# Patient Record
Sex: Female | Born: 1955
Health system: Southern US, Community
[De-identification: ages and names within clinical notes are randomized; demographics above are authoritative.]

## PROBLEM LIST (undated history)

## (undated) DIAGNOSIS — F329 Major depressive disorder, single episode, unspecified: Secondary | ICD-10-CM

## (undated) DIAGNOSIS — D509 Iron deficiency anemia, unspecified: Secondary | ICD-10-CM

## (undated) DIAGNOSIS — E785 Hyperlipidemia, unspecified: Secondary | ICD-10-CM

## (undated) DIAGNOSIS — F32A Depression, unspecified: Secondary | ICD-10-CM

## (undated) DIAGNOSIS — D72819 Decreased white blood cell count, unspecified: Secondary | ICD-10-CM

## (undated) DIAGNOSIS — T7840XA Allergy, unspecified, initial encounter: Secondary | ICD-10-CM

## (undated) HISTORY — DX: Major depressive disorder, single episode, unspecified: F32.9

## (undated) HISTORY — DX: Iron deficiency anemia, unspecified: D50.9

## (undated) HISTORY — DX: Hyperlipidemia, unspecified: E78.5

## (undated) HISTORY — DX: Decreased white blood cell count, unspecified: D72.819

## (undated) HISTORY — PX: TONSILLECTOMY: SUR1361

## (undated) HISTORY — DX: Allergy, unspecified, initial encounter: T78.40XA

## (undated) HISTORY — DX: Depression, unspecified: F32.A

---
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
## 1984-03-15 HISTORY — PX: CERVICAL BIOPSY: SHX590

## 1992-03-15 HISTORY — PX: EXPLORATORY LAPAROTOMY: SUR591

## 1992-03-15 HISTORY — PX: VAGINAL HYSTERECTOMY: SHX2639

## 1992-12-18 ENCOUNTER — Encounter: Payer: Self-pay | Admitting: Family Medicine

## 2001-03-15 HISTORY — PX: ELBOW SURGERY: SHX618

## 2004-01-22 ENCOUNTER — Ambulatory Visit: Payer: Self-pay | Admitting: Ophthalmology

## 2004-06-02 ENCOUNTER — Ambulatory Visit: Payer: Self-pay | Admitting: Unknown Physician Specialty

## 2005-08-27 ENCOUNTER — Ambulatory Visit: Payer: Self-pay | Admitting: Unknown Physician Specialty

## 2006-03-15 HISTORY — PX: COLONOSCOPY: SHX174

## 2006-03-15 HISTORY — PX: BREAST BIOPSY: SHX20

## 2006-05-14 LAB — CONVERTED CEMR LAB: Pap Smear: NORMAL

## 2006-07-14 LAB — HM COLONOSCOPY: HM Colonoscopy: NORMAL

## 2006-07-15 ENCOUNTER — Encounter: Payer: Self-pay | Admitting: Family Medicine

## 2006-07-15 ENCOUNTER — Ambulatory Visit: Payer: Self-pay | Admitting: Gastroenterology

## 2006-10-27 ENCOUNTER — Ambulatory Visit: Payer: Self-pay | Admitting: Unknown Physician Specialty

## 2006-11-02 ENCOUNTER — Ambulatory Visit: Payer: Self-pay | Admitting: Unknown Physician Specialty

## 2007-05-18 ENCOUNTER — Ambulatory Visit: Payer: Self-pay | Admitting: Family Medicine

## 2007-05-18 DIAGNOSIS — H8110 Benign paroxysmal vertigo, unspecified ear: Secondary | ICD-10-CM | POA: Insufficient documentation

## 2007-05-18 DIAGNOSIS — D509 Iron deficiency anemia, unspecified: Secondary | ICD-10-CM | POA: Insufficient documentation

## 2007-05-18 DIAGNOSIS — R002 Palpitations: Secondary | ICD-10-CM | POA: Insufficient documentation

## 2007-05-18 DIAGNOSIS — B009 Herpesviral infection, unspecified: Secondary | ICD-10-CM | POA: Insufficient documentation

## 2007-05-18 DIAGNOSIS — J309 Allergic rhinitis, unspecified: Secondary | ICD-10-CM | POA: Insufficient documentation

## 2007-05-18 DIAGNOSIS — E785 Hyperlipidemia, unspecified: Secondary | ICD-10-CM | POA: Insufficient documentation

## 2007-05-18 DIAGNOSIS — F3289 Other specified depressive episodes: Secondary | ICD-10-CM | POA: Insufficient documentation

## 2007-05-18 DIAGNOSIS — F329 Major depressive disorder, single episode, unspecified: Secondary | ICD-10-CM | POA: Insufficient documentation

## 2007-05-22 ENCOUNTER — Ambulatory Visit: Payer: Self-pay | Admitting: Family Medicine

## 2007-05-26 LAB — CONVERTED CEMR LAB
ALT: 18 units/L (ref 0–35)
AST: 24 units/L (ref 0–37)
Albumin: 4 g/dL (ref 3.5–5.2)
Alkaline Phosphatase: 23 units/L — ABNORMAL LOW (ref 39–117)
BUN: 13 mg/dL (ref 6–23)
Bilirubin, Direct: 0.1 mg/dL (ref 0.0–0.3)
CO2: 31 meq/L (ref 19–32)
Calcium: 9.4 mg/dL (ref 8.4–10.5)
Chloride: 108 meq/L (ref 96–112)
Cholesterol: 237 mg/dL (ref 0–200)
Creatinine, Ser: 0.9 mg/dL (ref 0.4–1.2)
Direct LDL: 124.4 mg/dL
GFR calc Af Amer: 85 mL/min
GFR calc non Af Amer: 70 mL/min
Glucose, Bld: 95 mg/dL (ref 70–99)
HDL: 95.2 mg/dL (ref >39.0)
Potassium: 4.4 meq/L (ref 3.5–5.1)
Sodium: 143 meq/L (ref 135–145)
Total Bilirubin: 0.9 mg/dL (ref 0.3–1.2)
Total CHOL/HDL Ratio: 2.5
Total Protein: 6.5 g/dL (ref 6.0–8.3)
Triglycerides: 69 mg/dL (ref 0–149)
VLDL: 14 mg/dL (ref 0–40)

## 2007-06-08 ENCOUNTER — Ambulatory Visit: Payer: Self-pay | Admitting: Family Medicine

## 2007-09-25 ENCOUNTER — Telehealth: Payer: Self-pay | Admitting: Family Medicine

## 2007-12-25 ENCOUNTER — Telehealth: Payer: Self-pay | Admitting: Family Medicine

## 2008-01-25 ENCOUNTER — Telehealth: Payer: Self-pay | Admitting: Family Medicine

## 2008-03-21 ENCOUNTER — Encounter: Payer: Self-pay | Admitting: Family Medicine

## 2008-03-21 ENCOUNTER — Ambulatory Visit: Payer: Self-pay | Admitting: Family Medicine

## 2008-03-25 ENCOUNTER — Encounter (INDEPENDENT_AMBULATORY_CARE_PROVIDER_SITE_OTHER): Payer: Self-pay | Admitting: *Deleted

## 2008-03-28 ENCOUNTER — Telehealth: Payer: Self-pay | Admitting: Family Medicine

## 2008-05-23 ENCOUNTER — Encounter (INDEPENDENT_AMBULATORY_CARE_PROVIDER_SITE_OTHER): Payer: Self-pay | Admitting: *Deleted

## 2008-05-30 ENCOUNTER — Ambulatory Visit: Payer: Self-pay | Admitting: Family Medicine

## 2008-05-30 LAB — CONVERTED CEMR LAB
Bilirubin Urine: NEGATIVE
Blood in Urine, dipstick: NEGATIVE
Glucose, Urine, Semiquant: NEGATIVE
Ketones, urine, test strip: NEGATIVE
Nitrite: NEGATIVE
Specific Gravity, Urine: 1.01
Urobilinogen, UA: 0.2
WBC Urine, dipstick: NEGATIVE
pH: 6.5

## 2008-05-31 ENCOUNTER — Ambulatory Visit: Payer: Self-pay | Admitting: Family Medicine

## 2008-06-02 LAB — CONVERTED CEMR LAB
ALT: 17 units/L (ref 0–35)
AST: 18 units/L (ref 0–37)
Albumin: 4 g/dL (ref 3.5–5.2)
Alkaline Phosphatase: 25 units/L — ABNORMAL LOW (ref 39–117)
BUN: 14 mg/dL (ref 6–23)
Basophils Absolute: 0 10*3/uL (ref 0.0–0.1)
Basophils Relative: 0.5 % (ref 0.0–3.0)
Bilirubin, Direct: 0 mg/dL (ref 0.0–0.3)
CO2: 30 meq/L (ref 19–32)
Calcium: 9.1 mg/dL (ref 8.4–10.5)
Chloride: 110 meq/L (ref 96–112)
Cholesterol: 259 mg/dL — ABNORMAL HIGH (ref 0–200)
Creatinine, Ser: 0.9 mg/dL (ref 0.4–1.2)
Direct LDL: 163.8 mg/dL
Eosinophils Absolute: 0.2 10*3/uL (ref 0.0–0.7)
Eosinophils Relative: 5.3 % — ABNORMAL HIGH (ref 0.0–5.0)
Glucose, Bld: 78 mg/dL (ref 70–99)
HCT: 35.2 % — ABNORMAL LOW (ref 36.0–46.0)
HDL: 73.2 mg/dL (ref >39.00)
Hemoglobin: 12.2 g/dL (ref 12.0–15.0)
Lymphocytes Relative: 22.6 % (ref 12.0–46.0)
Lymphs Abs: 0.9 10*3/uL (ref 0.7–4.0)
MCHC: 34.6 g/dL (ref 30.0–36.0)
MCV: 90.4 fL (ref 78.0–100.0)
Monocytes Absolute: 0.3 10*3/uL (ref 0.1–1.0)
Monocytes Relative: 7 % (ref 3.0–12.0)
Neutro Abs: 2.6 10*3/uL (ref 1.4–7.7)
Neutrophils Relative %: 64.6 % (ref 43.0–77.0)
Platelets: 172 10*3/uL (ref 150.0–400.0)
Potassium: 4.3 meq/L (ref 3.5–5.1)
RBC: 3.89 M/uL (ref 3.87–5.11)
RDW: 12 % (ref 11.5–14.6)
Sed Rate: 9 mm/hr (ref 0–22)
Sodium: 144 meq/L (ref 135–145)
TSH: 2.45 microintl units/mL (ref 0.35–5.50)
Total Bilirubin: 0.7 mg/dL (ref 0.3–1.2)
Total CHOL/HDL Ratio: 4
Total Protein: 6.3 g/dL (ref 6.0–8.3)
Triglycerides: 87 mg/dL (ref 0.0–149.0)
VLDL: 17.4 mg/dL (ref 0.0–40.0)
WBC: 4 10*3/uL — ABNORMAL LOW (ref 4.5–10.5)

## 2008-06-04 ENCOUNTER — Encounter (INDEPENDENT_AMBULATORY_CARE_PROVIDER_SITE_OTHER): Payer: Self-pay | Admitting: *Deleted

## 2008-06-26 ENCOUNTER — Ambulatory Visit: Payer: Self-pay | Admitting: Family Medicine

## 2008-06-26 DIAGNOSIS — Z780 Asymptomatic menopausal state: Secondary | ICD-10-CM | POA: Insufficient documentation

## 2008-09-30 ENCOUNTER — Ambulatory Visit: Payer: Self-pay | Admitting: Family Medicine

## 2008-09-30 LAB — CONVERTED CEMR LAB
ALT: 25 units/L (ref 0–35)
AST: 37 units/L (ref 0–37)
Cholesterol: 192 mg/dL (ref 0–200)
HDL: 84.6 mg/dL (ref >39.00)
LDL Cholesterol: 91 mg/dL (ref 0–99)
Total CHOL/HDL Ratio: 2
Triglycerides: 80 mg/dL (ref 0.0–149.0)
VLDL: 16 mg/dL (ref 0.0–40.0)

## 2009-01-16 ENCOUNTER — Ambulatory Visit: Payer: Self-pay | Admitting: Family Medicine

## 2009-01-16 DIAGNOSIS — M279 Disease of jaws, unspecified: Secondary | ICD-10-CM | POA: Insufficient documentation

## 2009-01-17 LAB — CONVERTED CEMR LAB: Sed Rate: 10 mm/hr (ref 0–22)

## 2009-02-04 ENCOUNTER — Telehealth: Payer: Self-pay | Admitting: Family Medicine

## 2009-04-02 ENCOUNTER — Encounter (INDEPENDENT_AMBULATORY_CARE_PROVIDER_SITE_OTHER): Payer: Self-pay | Admitting: *Deleted

## 2009-04-04 ENCOUNTER — Telehealth: Payer: Self-pay | Admitting: Family Medicine

## 2009-04-18 ENCOUNTER — Ambulatory Visit: Payer: Self-pay | Admitting: Family Medicine

## 2009-04-18 LAB — CONVERTED CEMR LAB
ALT: 21 units/L (ref 0–35)
AST: 25 units/L (ref 0–37)
Cholesterol: 178 mg/dL (ref 0–200)
HDL: 87.8 mg/dL (ref >39.00)
LDL Cholesterol: 77 mg/dL (ref 0–99)
Total CHOL/HDL Ratio: 2
Triglycerides: 67 mg/dL (ref 0.0–149.0)
VLDL: 13.4 mg/dL (ref 0.0–40.0)

## 2009-05-05 ENCOUNTER — Telehealth: Payer: Self-pay | Admitting: Family Medicine

## 2009-06-04 ENCOUNTER — Telehealth: Payer: Self-pay | Admitting: Family Medicine

## 2009-06-10 ENCOUNTER — Telehealth (INDEPENDENT_AMBULATORY_CARE_PROVIDER_SITE_OTHER): Payer: Self-pay | Admitting: *Deleted

## 2009-07-14 ENCOUNTER — Telehealth: Payer: Self-pay | Admitting: Family Medicine

## 2009-07-16 ENCOUNTER — Telehealth: Payer: Self-pay | Admitting: Family Medicine

## 2009-08-22 ENCOUNTER — Ambulatory Visit: Payer: Self-pay | Admitting: Family Medicine

## 2009-08-22 ENCOUNTER — Encounter: Payer: Self-pay | Admitting: Family Medicine

## 2009-08-22 LAB — HM MAMMOGRAPHY: HM Mammogram: NORMAL

## 2009-08-25 ENCOUNTER — Encounter (INDEPENDENT_AMBULATORY_CARE_PROVIDER_SITE_OTHER): Payer: Self-pay | Admitting: *Deleted

## 2009-09-02 ENCOUNTER — Ambulatory Visit: Payer: Self-pay | Admitting: Family Medicine

## 2009-09-02 ENCOUNTER — Telehealth (INDEPENDENT_AMBULATORY_CARE_PROVIDER_SITE_OTHER): Payer: Self-pay | Admitting: *Deleted

## 2009-09-02 DIAGNOSIS — R5383 Other fatigue: Secondary | ICD-10-CM

## 2009-09-02 DIAGNOSIS — R5381 Other malaise: Secondary | ICD-10-CM | POA: Insufficient documentation

## 2009-09-07 LAB — CONVERTED CEMR LAB
ALT: 27 units/L (ref 0–35)
AST: 29 units/L (ref 0–37)
Albumin: 4.4 g/dL (ref 3.5–5.2)
Alkaline Phosphatase: 34 units/L — ABNORMAL LOW (ref 39–117)
BUN: 18 mg/dL (ref 6–23)
Basophils Absolute: 0 10*3/uL (ref 0.0–0.1)
Basophils Relative: 0.5 % (ref 0.0–3.0)
Bilirubin, Direct: 0.1 mg/dL (ref 0.0–0.3)
CO2: 29 meq/L (ref 19–32)
Calcium: 9.4 mg/dL (ref 8.4–10.5)
Chloride: 107 meq/L (ref 96–112)
Cholesterol: 185 mg/dL (ref 0–200)
Creatinine, Ser: 0.9 mg/dL (ref 0.4–1.2)
Eosinophils Absolute: 0.1 10*3/uL (ref 0.0–0.7)
Eosinophils Relative: 3 % (ref 0.0–5.0)
GFR calc non Af Amer: 71.14 mL/min (ref >60)
Glucose, Bld: 84 mg/dL (ref 70–99)
HCT: 34.6 % — ABNORMAL LOW (ref 36.0–46.0)
HDL: 90.5 mg/dL (ref >39.00)
Hemoglobin: 12 g/dL (ref 12.0–15.0)
LDL Cholesterol: 79 mg/dL (ref 0–99)
Lymphocytes Relative: 24.1 % (ref 12.0–46.0)
Lymphs Abs: 1 10*3/uL (ref 0.7–4.0)
MCHC: 34.7 g/dL (ref 30.0–36.0)
MCV: 92.2 fL (ref 78.0–100.0)
Monocytes Absolute: 0.3 10*3/uL (ref 0.1–1.0)
Monocytes Relative: 6.8 % (ref 3.0–12.0)
Neutro Abs: 2.6 10*3/uL (ref 1.4–7.7)
Neutrophils Relative %: 65.6 % (ref 43.0–77.0)
Platelets: 195 10*3/uL (ref 150.0–400.0)
Potassium: 4.7 meq/L (ref 3.5–5.1)
RBC: 3.75 M/uL — ABNORMAL LOW (ref 3.87–5.11)
RDW: 13.5 % (ref 11.5–14.6)
Sodium: 141 meq/L (ref 135–145)
TSH: 2.68 microintl units/mL (ref 0.35–5.50)
Total Bilirubin: 0.7 mg/dL (ref 0.3–1.2)
Total CHOL/HDL Ratio: 2
Total Protein: 6.9 g/dL (ref 6.0–8.3)
Triglycerides: 77 mg/dL (ref 0.0–149.0)
VLDL: 15.4 mg/dL (ref 0.0–40.0)
Vit D, 25-Hydroxy: 52 ng/mL (ref 30–89)
WBC: 4 10*3/uL — ABNORMAL LOW (ref 4.5–10.5)

## 2009-09-09 ENCOUNTER — Ambulatory Visit: Payer: Self-pay | Admitting: Family Medicine

## 2009-09-09 LAB — HM PAP SMEAR

## 2009-09-09 LAB — CONVERTED CEMR LAB

## 2009-11-03 ENCOUNTER — Telehealth: Payer: Self-pay | Admitting: Family Medicine

## 2009-12-10 ENCOUNTER — Telehealth: Payer: Self-pay | Admitting: Family Medicine

## 2010-01-06 ENCOUNTER — Telehealth: Payer: Self-pay | Admitting: Family Medicine

## 2010-02-02 ENCOUNTER — Telehealth: Payer: Self-pay | Admitting: Family Medicine

## 2010-02-27 ENCOUNTER — Telehealth: Payer: Self-pay | Admitting: Family Medicine

## 2010-03-17 ENCOUNTER — Telehealth: Payer: Self-pay | Admitting: Family Medicine

## 2010-04-09 ENCOUNTER — Telehealth: Payer: Self-pay | Admitting: Family Medicine

## 2010-04-14 NOTE — Letter (Signed)
Summary: Dr.DeFrancesco Records  Dr.DeFrancesco Records   Imported By: Beau Fanny 10/05/2007 13:27:34  _____________________________________________________________________  External Attachment:    Type:   Image     Comment:   External Document

## 2010-04-14 NOTE — Assessment & Plan Note (Signed)
Summary: CPX   Vital Signs:  Patient Profile:   55 Years Old Female Height:     63 inches Weight:      130.13 pounds Temp:     98.1 degrees F oral Pulse rate:   80 / minute Pulse rhythm:   regular BP sitting:   102 / 54  (left arm) Cuff size:   regular  Vitals Entered By: Delilah Shan (June 08, 2007 11:23 AM)                 Chief Complaint:  CPX.  History of Present Illness: no breast lesions, no nipple discharge no vaginal discharge.     Current Allergies (reviewed today): No known allergies   Past Medical History:    Reviewed history from 05/18/2007 and no changes required:       Anemia-iron deficiency       Depression       Allergic rhinitis       Hyperlipidemia  Past Surgical History:    1994 hysterectomy, vaginal, no cervix, still has ovaries    24 hour hear tmonitor neg    1973 pilonidal cyst removal    1990,1993 C section    1986 cone biopsy of cervix ;     1994 exp lap for mennorhagia, no endometriosis    2003 elbow surgery for chronic tendonitis    Tonsillectomy    2008 breast needle biopsy at [facility]: fibrocystic disease     Review of Systems  ENT      allergies improved with allergra  CV      Denies chest pain or discomfort.  Resp      Denies cough and shortness of breath.  GI      Denies abdominal pain, bloody stools, and change in bowel habits.  GU      Denies dysuria.  MS      Denies joint pain.  Derm      no concerning skin lesions  Psych      Denies depression, easily tearful, and panic attacks.      well controlled mood off medication   Physical Exam  General:     Well-developed,well-nourished,in no acute distress; alert,appropriate and cooperative throughout examination Head:     Normocephalic and atraumatic without obvious abnormalities. No apparent alopecia or balding. Neck:     no carotid bruit or thyromegaly  Breasts:     No mass, nodules, thickening, tenderness, bulging, retraction, inflamation, nipple  discharge or skin changes noted.  Scar at left breast 1 ocklock from prevvious bx. Lungs:     Normal respiratory effort, chest expands symmetrically. Lungs are clear to auscultation, no crackles or wheezes. Heart:     Normal rate and regular rhythm. S1 and S2 normal without gallop, murmur, click, rub or other extra sounds. Abdomen:     Bowel sounds positive,abdomen soft and non-tender without masses, organomegaly or hernias noted. Genitalia:     Pelvic Exam:        External: normal female genitalia without lesions or masses        Vagina: normal without lesions or masses Cervix: not present        Adnexa: normal bimanual exam without masses or fullness        Uterus: not present        Pap smear: not performed Pulses:     R and L posterior tibial pulses are full and equal bilaterally     Impression & Recommendations:  Problem # 1:  Preventive Health Care (ICD-V70.0) Updated vaccines. Cholesterol and DM up to date.  No pap done because no cervix.   Encouraged continued  exercise and healthy eating habits.   Problem # 2:  ROUTINE GYNECOLOGICAL EXAMINATION (ICD-V72.31) Nml bimanual exam.  No cervix present.   Complete Medication List: 1)  Estradiol 0.1 Mg/24hr Ptwk (Estradiol) .... Take 1 tablet by mouth once a day 2)  Cal/mag Citrate 250-125 Mg Tabs (Calcium-magnesium) .... Take 1 tablet by mouth two times a day 3)  Gnp Flax Seed Oil 1000 Mg Caps (Flaxseed (linseed)) .... Take 1 capsule by mouth once a day 4)  Milk Thistle 140 Mg Caps (Milk thistle) .... Take 1 capsule by mouth once a day 5)  Cvs Vitamin B-6 100 Mg Tabs (Pyridoxine hcl) .... Take 1 tablet by mouth once a day 6)  Vitamin C 500 Mg Tabs (Ascorbic acid) .... Take 1 tablet by mouth once a day 7)  Vitamin D 400 Unit Caps (Cholecalciferol) .... Take 1 capsule by mouth once a day 8)  Resvertal  .... Once daily 9)  Valtrex 1 Gm Tabs (Valacyclovir hcl) .... As needed herpes out break 10)  Fexofenadine Hcl 180 Mg Tabs  (Fexofenadine hcl) .... Take 1 tablet by mouth once a day 11)  Nasonex 50 Mcg/act Susp (Mometasone furoate) .... 2 sprays per nostril daily     ] Current Allergies (reviewed today): No known allergies  Current Medications (including changes made in today's visit):  ESTRADIOL 0.1 MG/24HR  PTWK (ESTRADIOL) Take 1 tablet by mouth once a day CAL/MAG CITRATE 250-125 MG  TABS (CALCIUM-MAGNESIUM) Take 1 tablet by mouth two times a day GNP FLAX SEED OIL 1000 MG  CAPS (FLAXSEED (LINSEED)) Take 1 capsule by mouth once a day MILK THISTLE 140 MG  CAPS (MILK THISTLE) Take 1 capsule by mouth once a day CVS VITAMIN B-6 100 MG  TABS (PYRIDOXINE HCL) Take 1 tablet by mouth once a day VITAMIN C 500 MG  TABS (ASCORBIC ACID) Take 1 tablet by mouth once a day VITAMIN D 400 UNIT  CAPS (CHOLECALCIFEROL) Take 1 capsule by mouth once a day * RESVERTAL once daily VALTREX 1 GM  TABS (VALACYCLOVIR HCL) as needed herpes out break FEXOFENADINE HCL 180 MG  TABS (FEXOFENADINE HCL) Take 1 tablet by mouth once a day NASONEX 50 MCG/ACT  SUSP (MOMETASONE FUROATE) 2 sprays per nostril daily  Flex Sig Next Due:  Not Indicated Hemoccult Next Due:  Not Indicated   Appended Document: CPX   Tetanus/Td Vaccine    Vaccine Type: Tdap    Site: left deltoid    Mfr: Sanofi Pasteur    Dose: 0.5 ml    Route: IM    Given by: Delilah Shan    Exp. Date: 04/30/2009    Lot #: [id number]    VIS given: 01/31/07 version given June 08, 2007.

## 2010-04-14 NOTE — Progress Notes (Signed)
----   Converted from flag ---- ---- 09/01/2009 9:56 AM, Hannah Beat MD wrote: Prephysical Labs, several days before, fasting BMP, HFP, FLP, CBC with diff, TSH, Vit D: 272.4, v77.1, ,780.79  ---- 09/01/2009 7:56 AM, Liane Comber CMA (AAMA) wrote: Ermalene Searing pt is scheduled for cpx labs tomorrow, what labs to draw and dx codes? Thanks Tasha ------------------------------

## 2010-04-14 NOTE — Progress Notes (Signed)
Summary: Mammogram scheduled...  Phone Note Call from Patient   Caller: Patient Summary of Call: please put order in for a MMG at [facility] . Call pt back at [phone]. Initial call taken by: Carlton Adam,  June 10, 2009 12:39 PM  Follow-up for Phone Call        Mammogram scheduled w/ [facility].Daine Gip  June 18, 2009 4:37 PM Follow-up by: Daine Gip,  June 18, 2009 4:37 PM

## 2010-04-14 NOTE — Letter (Signed)
Summary: Results Follow-up Letter  [place] at [facility]  [address] [place], [place] [address]   Phone: [phone]  Fax: [phone]    06/04/2008       Natasha Dixon [address] [place], [place]  [address]  Dear [name],   The following are the results of your recent test(s):  Test     Result     Pap Smear    Normal_______  Not Normal_____       Comments: _________________________________________________________ Cholesterol LDL(Bad cholesterol):          Your goal is less than:         HDL (Good cholesterol):        Your goal is more than: _________________________________________________________ Other Tests:   _________________________________________________________  Please call for an appointment Or ___Please see attached lab reesults.______________________________________________________ _________________________________________________________ _________________________________________________________  Sincerely,  Ardy[place] Man [place] at [facility]

## 2010-04-14 NOTE — Progress Notes (Signed)
Summary: Rx Tramadol  Phone Note Refill Request Call back at [phone] [facility] on February 02, 2010 12:43 PM  Refills Requested: Medication #1:  TRAMADOL HCL 50 MG TABS 1-2 tab by mouth at bedtime as needed pain   Last Refilled: 01/06/2010  Method Requested: Electronic Initial call taken by: Sydell Axon LPN,  February 02, 2010 12:43 PM    Prescriptions: TRAMADOL HCL 50 MG TABS (TRAMADOL HCL) 1-2 tab by mouth at bedtime as needed pain  #30 Tablet x 0   Entered and Authorized by:   Ruthe Mannan MD   Signed by:   Ruthe Mannan MD on 02/02/2010   Method used:   [facility]        [id number]       [address]       [place], [place]  [address]       Ph: [phone]       Fax: [phone]   RxID:   [id number]

## 2010-04-14 NOTE — Assessment & Plan Note (Signed)
Summary: ?RASH/CLE   Vital Signs:  Patient profile:   55 year old female Height:      63 inches Weight:      131.50 pounds BMI:     23.38 Temp:     98.2 degrees F oral Pulse rate:   72 / minute Pulse rhythm:   regular BP sitting:   102 / 66  (left arm) Cuff size:   regular  Vitals Entered By: Delilah Shan (May 30, 2008 8:55 AM) CC: Rash   History of Present Illness: In 01/2008 broke out in rash on arms B, very puritic, red bumpy, felt burning at night. Saw dermatologist..dx with contact dermatitis, unknown trigger...gave steroid creams. Spread to thighs. Also treated for scabies. Derm took biopsy...showed nothing. Given oral course of prednison...Marland Kitchenheped some Then gradually began improving by 03/08/2008  Since then she continued to have some skin itchyness. Until 3 weeks ago had similar breakout on arms.  Has also noted cicrles under eyes, eyes occ red. Does have some postnasal drip.Marland Kitchen minimal improvement wit fexofenadine last year Occ urine cloudy, strange odor.  Fatigue for a longtime. Stress in life. Some joint pain in Ams, shoulders, hands, wrists. No stool changes. No fever, no N/V.  On estradiol but still has a lot of hot flashes. Cold during the day.     Allergies: No Known Drug Allergies  Physical Exam  General:  Well-developed,well-nourished,in no acute distress; alert,appropriate and cooperative throughout examination Eyes:  circles under eyes, slight conjunctival erythmea Ears:  External ear exam shows no significant lesions or deformities.  Otoscopic examination reveals clear canals, tympanic membranes are intact bilaterally without bulging, retraction, inflammation or discharge. Hearing is grossly normal bilaterally. Nose:  External nasal examination shows no deformity or inflammation. Nasal mucosa are pink and moist without lesions or exudates. Mouth:  Oral mucosa and oropharynx without lesions or exudates.  Teeth in good repair. Neck:  no carotid  bruit or thyromegaly no cervical or supraclavicular lymphadenopathy  Lungs:  Normal respiratory effort, chest expands symmetrically. Lungs are clear to auscultation, no crackles or wheezes. Heart:  Normal rate and regular rhythm. S1 and S2 normal without gallop, murmur, click, rub or other extra sounds. Abdomen:  Bowel sounds positive,abdomen soft and non-tender without masses, organomegaly or hernias noted. Skin:  erythemaotis papules on arms and low back, on back slight diffuse erythema and warmth Psych:  Cognition and judgment appear intact. Alert and cooperative with normal attention span and concentration. No apparent delusions, illusions, hallucinations   Impression & Recommendations:  Problem # 1:  SKIN RASH (ICD-782.1) likely allergic dermatitis. Will eval for other etiologuses given additional symptoms of fatigue etc. Likely needs antihistamine and allergy referral for skin testing. No clear med trigger Her updated medication list for this problem includes:    Clotrimazole-betamethasone 1-0.05 % Crea (Clotrimazole-betamethasone) .Marland Kitchen... Aaa as directed  Problem # 2:  OTHER ABNORMALITY OF URINATION (UEA-540.98)  Orders: UA Dipstick w/o Micro (manual) (11914)  Complete Medication List: 1)  Cal/mag Citrate 250-125 Mg Tabs (Calcium-magnesium) .... Take 1 tablet by mouth two times a day 2)  Gnp Flax Seed Oil 1000 Mg Caps (Flaxseed (linseed)) .... Take 1 capsule by mouth once a day 3)  Milk Thistle 140 Mg Caps (Milk thistle) .... Take 1 capsule by mouth once a day 4)  Cvs Vitamin B-6 100 Mg Tabs (Pyridoxine hcl) .... Take 1 tablet by mouth once a day 5)  Vitamin C 500 Mg Tabs (Ascorbic acid) .... Take 1 tablet by mouth once a  day 6)  Vitamin D 400 Unit Caps (Cholecalciferol) .... Take 1 capsule by mouth once a day 7)  Valtrex 1 Gm Tabs (Valacyclovir hcl) .Marland Kitchen.. 1 tab po twice daily x 1 day as needed herpes out break 8)  Nasonex 50 Mcg/act Susp (Mometasone furoate) .... 2 sprays per nostril  daily as needed 9)  Clotrimazole-betamethasone 1-0.05 % Crea (Clotrimazole-betamethasone) .... Aaa as directed 10)  Estradiol 1 Mg Tabs (Estradiol) .... Take 1 tablet by mouth once a day  Patient Instructions: 1)  Fasting lasb Dx 782.1 sed rate, cbc, TSH, Dx 272.0 CMET, lipids 2)  The medication list was reviewed and reconciled.  All changed / newly prescribed medications were explained.  A complete medication list was provided to the patient / caregiver.      Current Allergies (reviewed today): No known allergies  Laboratory Results   Urine Tests   Date/Time Reported: May 30, 2008 9:45 AM   Routine Urinalysis   Color: yellow Appearance: Clear Glucose: negative   (Normal Range: Negative) Bilirubin: negative   (Normal Range: Negative) Ketone: negative   (Normal Range: Negative) Spec. Gravity: 1.010   (Normal Range: 1.003-1.035) Blood: negative   (Normal Range: Negative) pH: 6.5   (Normal Range: 5.0-8.0) Protein: trace   (Normal Range: Negative) Urobilinogen: 0.2   (Normal Range: 0-1) Nitrite: negative   (Normal Range: Negative) Leukocyte Esterace: negative   (Normal Range: Negative)

## 2010-04-14 NOTE — Progress Notes (Signed)
Summary: wants labs prior to physical  Phone Note Call from Patient Call back at Home Phone [phone]   Caller: Patient Call For: Kerby Nora MD Summary of Call: Pt has cpx scheduled for 4/14 and would like labs ordered prior.  Wants cholesterol and liver and kidney functions checked. Initial call taken by: Lowella Petties,  March 28, 2008 4:49 PM  Follow-up for Phone Call        Dx 272.4 CMET, lipids fasting Follow-up by: Kerby Nora MD,  March 29, 2008 3:06 PM  Additional Follow-up for Phone Call Additional follow up Details #1::        Husband advised.  Lab appointment scheduled:  06/21/2008 at 8:25 a.m.  (fasting) Additional Follow-up by: Delilah Shan,  March 29, 2008 3:27 PM

## 2010-04-14 NOTE — Letter (Signed)
Summary: [place] No Show Letter  [place] at [facility]  [address] [place], [place] [address]   Phone: [phone]  Fax: [phone]    04/02/2009 MRN: [id number]  [facility] [address] [place], [place]  [address]   Dear Ms. Cherne,   Our records indicate that you missed your scheduled appointment with __lab___________________ on _1.19.11___________.  Please contact this office to reschedule your appointment as soon as possible.  It is important that you keep your scheduled appointments with your physician, so we can provide you the best care possible.  Please be advised that there may be a charge for "no show" appointments.    Sincerely,   [place] at [facility]

## 2010-04-14 NOTE — Progress Notes (Signed)
Summary: tramadol  Phone Note Refill Request Message from:  [employer] on May 05, 2009 8:45 AM  Refills Requested: Medication #1:  TRAMADOL HCL 50 MG TABS 1-2 tab by mouth at bedtime as needed pain.   [facility]ed[facility]   Method Requested: Electronic Initial call taken by: Benny Lennert CMA Duncan Dull),  May 05, 2009 8:45 AM  Follow-up for Phone Call        Rx called to pharmacy Follow-up by: Benny Lennert CMA Duncan Dull),  May 05, 2009 11:29 AM    Prescriptions: TRAMADOL HCL 50 MG TABS (TRAMADOL HCL) 1-2 tab by mouth at bedtime as needed pain  #30 Tablet x 0   Entered and Authorized by:   Kerby Nora MD   Signed by:   Kerby Nora MD on 05/05/2009   Method used:   [facility] ...       [facility]  [address] [id number]       [address]       [place], [place]  [address]       Ph: [phone]       Fax: [phone]   RxID:   [id number]

## 2010-04-14 NOTE — Assessment & Plan Note (Signed)
Summary: NEW PT TO EST/CLE   Vital Signs:  Patient Profile:   55 Years Old Female Height:     63 inches Weight:      131 pounds Temp:     97.7 degrees F oral Pulse rate:   60 / minute Pulse rhythm:   regular BP sitting:   92 / 56  (left arm) Cuff size:   regular  Vitals Entered By: Delilah Shan (May 18, 2007 2:06 PM)                 Chief Complaint:  New Patient to Establish.  History of Present Illness: Allergies and BPPV: has constant PND, congestion Benadryl, zyrtec with no improvement, and felt groggy Does Eply manuever's intermittantly  High cholesterol: last checked 3/08 was elevated, had been on pravochol but took herself off to try dietary changes and exercise quit smoking  ? hx/o depression: had been on wellbutrin till she took herself off in 9/08 She feels she has been doing well off of it.  Still has hot flashes at night despite estradiol 1 mg daily     Current Allergies (reviewed today): No known allergies   Past Medical History:    Anemia-iron deficiency    Depression    Allergic rhinitis    Hyperlipidemia  Past Surgical History:    1994 hysterectomy, vaginal, ?cervix, still has ovaries    24 hour hear tmonitor neg    1973 pilonidal cyst removal    1990,1993 C section    1986 cone biopsy of cervix ;     1994 exp lap for mennorhagia, no endometriosis    2003 elbow surgery for chronic tendonitis    Tonsillectomy    2008 breast needle biopsy at [facility]: fibrocystic disease   Family History:    mother: high chol    father: arthritis, CAD, Mi at age 4    Family History of CAD Female 1st degree relative <65    sister: HTN, high chol, hypothyroid    aunt: hypothyroid    no cancer    PGF: carotid artery blockage  Social History:    Occupation: housewife    Married    2 children: healthy    Former Smoker 20 pack year history, Quit 9/08    Alcohol use-yes, wine, cocktails on weekends    Drug use-yes, remote marijuana    Regular  exercise-yes, 3 x a week    Diet: increasing fish, lean meats, low carb   Risk Factors:  Tobacco use:  quit Drug use:  yes Alcohol use:  yes Exercise:  yes  Mammogram History:     Date of Last Mammogram:  10/14/2006    Results:  Abnormal Left   Colonoscopy History:     Date of Last Colonoscopy:  07/14/2006    Results:  Normal   PAP Smear History:     Date of Last PAP Smear:  05/14/2006    Results:  Normal    Review of Systems       couple times a week feels like heart skips beat, not progressive, kasts sec   Physical Exam  General:     Well-developed,well-nourished,in no acute distress; alert,appropriate and cooperative throughout examination Head:     Normocephalic and atraumatic without obvious abnormalities. No apparent alopecia or balding. Ears:     External ear exam shows no significant lesions or deformities.  Otoscopic examination reveals clear canals, tympanic membranes are intact bilaterally without bulging, retraction, inflammation or discharge. Hearing is grossly  normal bilaterally. Nose:     nasal dischargemucosal pallor.   Mouth:     PND Lungs:     Normal respiratory effort, chest expands symmetrically. Lungs are clear to auscultation, no crackles or wheezes. Heart:     Normal rate and regular rhythm. S1 and S2 normal without gallop, murmur, click, rub or other extra sounds. Abdomen:     Bowel sounds positive,abdomen soft and non-tender without masses, organomegaly or hernias noted. Pulses:     R and L posterior tibial pulses are full and equal bilaterally  Extremities:     no edema    Impression & Recommendations:  Problem # 1:  ALLERGIC RHINITIS (ICD-477.9) Allergen avoidance. treat with fexofenadine and nasonex. Follow up if not improving Her updated medication list for this problem includes:    Fexofenadine Hcl 180 Mg Tabs (Fexofenadine hcl) .Marland Kitchen... Take 1 tablet by mouth once a day    Nasonex 50 Mcg/act Susp (Mometasone furoate) .Marland Kitchen... 2  sprays per nostril daily   Problem # 2:  DEPRESSION (ICD-311) well controlled off medication  Problem # 3:  PALPITATIONS, RECURRENT (ICD-785.1) If continues, consider 28 day holter with referral to cardiology.  Problem # 4:  HYPERLIPIDEMIA (ICD-272.4) Due for check off of medication.  Problem # 5:  Preventive Health Care (ICD-V70.0) Assessment: Comment Only ? last tetanus. Due for ? pap (? cervix), bimanual and breast exam.   Complete Medication List: 1)  Estradiol 0.1 Mg/24hr Ptwk (Estradiol) .... Take 1 tablet by mouth once a day 2)  Cal/mag Citrate 250-125 Mg Tabs (Calcium-magnesium) .... Take 1 tablet by mouth two times a day 3)  Gnp Flax Seed Oil 1000 Mg Caps (Flaxseed (linseed)) .... Take 1 capsule by mouth once a day 4)  Milk Thistle 140 Mg Caps (Milk thistle) .... Take 1 capsule by mouth once a day 5)  [facility]vs Vitamin B-6 100 Mg Tabs (Pyridoxine hcl) .... Take 1 tablet by mouth once a day 6)  Vitamin C 500 Mg Tabs (Ascorbic acid) .... Take 1 tablet by mouth once a day 7)  Vitamin D 400 Unit Caps (Cholecalciferol) .... Take 1 capsule by mouth once a day 8)  Resvertal  .... Once daily 9)  Valtrex 1 Gm Tabs (Valacyclovir hcl) .... As needed herpes out break 10)  Fexofenadine Hcl 180 Mg Tabs (Fexofenadine hcl) .... Take 1 tablet by mouth once a day 11)  Nasonex 50 Mcg/act Susp (Mometasone furoate) .... 2 sprays per nostril daily   Patient Instructions: 1)  Fasting lipids, CMET Dx 272.0 2)  Schedule CPE. 3)  Trial of nasonex and fexofenadine. Call if no improvement after 2 weeks.    Prescriptions: FEXOFENADINE HCL 180 MG  TABS (FEXOFENADINE HCL) Take 1 tablet by mouth once a day  #30 x 5   Entered and Authorized by:   Kerby Nora MD   Signed by:   Kerby Nora MD on 05/18/2007   Method used:   Print then Give to Patient   RxID:   [id number]  ] Current Allergies (reviewed today): No known allergies  Current Medications (including changes made in today's visit):    ESTRADIOL 0.1 MG/24HR  PTWK (ESTRADIOL) Take 1 tablet by mouth once a day CAL/MAG CITRATE 250-125 MG  TABS (CALCIUM-MAGNESIUM) Take 1 tablet by mouth two times a day GNP FLAX SEED OIL 1000 MG  CAPS (FLAXSEED (LINSEED)) Take 1 capsule by mouth once a day MILK THISTLE 140 MG  CAPS (MILK THISTLE) Take 1 capsule by mouth once a day  [facility]S VITAMIN B-6 100 MG  TABS (PYRIDOXINE HCL) Take 1 tablet by mouth once a day VITAMIN C 500 MG  TABS (ASCORBIC ACID) Take 1 tablet by mouth once a day VITAMIN D 400 UNIT  CAPS (CHOLECALCIFEROL) Take 1 capsule by mouth once a day * RESVERTAL once daily VALTREX 1 GM  TABS (VALACYCLOVIR HCL) as needed herpes out break FEXOFENADINE HCL 180 MG  TABS (FEXOFENADINE HCL) Take 1 tablet by mouth once a day NASONEX 50 MCG/ACT  SUSP (MOMETASONE FUROATE) 2 sprays per nostril daily

## 2010-04-14 NOTE — Assessment & Plan Note (Signed)
Summary: CPX/DLO   Vital Signs:  Patient profile:   55 year old female Height:      63 inches Weight:      134 pounds BMI:     23.82 Temp:     97.7 degrees F oral Pulse rate:   76 / minute Pulse rhythm:   regular BP sitting:   94 / 56  (left arm) Cuff size:   regular  Vitals Entered By: Delilah Shan (June 26, 2008 2:25 PM)  History of Present Illness: Chief Complaint:  CPX  Rash has resolved. Fatigue still continues. Her biggest concern is hot flashes at night. On estradiol 1 mg daily.  Not sleeping well at night due to this. not interested SSRIs. Voices verbal understanding of risk of CAD with estrogen replacement and increase risk with increasing dose.   In summer has issue with yeast at panty line..has improved with clotrimazole crem in past. Wants to have an rx on hand for this.  needs refill on hepres medicaiton for outbreaks on finger.   Problems Prior to Update: 1)  Other Abnormality of Urination  (ICD-788.69) 2)  Skin Rash  (ICD-782.1) 3)  Other Screening Mammogram  (ICD-V76.12) 4)  Well Woman  (ICD-V70.0) 5)  Routine Gynecological Examination  (ICD-V72.31) 6)  Herpes Simplex Infection  (ICD-054.9) 7)  Family History of Cad Female 1st Degree Relative <50  (ICD-V17.3) 8)  Palpitations, Recurrent  (ICD-785.1) 9)  Hyperlipidemia  (ICD-272.4) 10)  Benign Positional Vertigo  (ICD-386.11) 11)  Allergic Rhinitis  (ICD-477.9) 12)  Depression  (ICD-311) 13)  Anemia-iron Deficiency  (ICD-280.9)  Current Medications (verified): 1)  Cal/mag Citrate 250-125 Mg  Tabs (Calcium-Magnesium) .... Take 1 Tablet By Mouth Two Times A Day 2)  Gnp Flax Seed Oil 1000 Mg  Caps (Flaxseed (Linseed)) .... Take 1 Capsule By Mouth Once A Day 3)  Milk Thistle 140 Mg  Caps (Milk Thistle) .... Take 1 Capsule By Mouth Once A Day 4)  Cvs Vitamin B-6 100 Mg  Tabs (Pyridoxine Hcl) .... Take 1 Tablet By Mouth Once A Day 5)  Vitamin C 500 Mg  Tabs (Ascorbic Acid) .... Take 1 Tablet By Mouth Once  A Day 6)  Vitamin D 400 Unit  Caps (Cholecalciferol) .... Take 1 Capsule By Mouth Once A Day 7)  Valtrex 1 Gm  Tabs (Valacyclovir Hcl) .Marland Kitchen.. 1 Tab By Mouth Twice Daily X 1 Day As Needed Herpes Out Break 8)  Nasonex 50 Mcg/act  Susp (Mometasone Furoate) .... 2 Sprays Per Nostril Daily As Needed 9)  Clotrimazole-Betamethasone 1-0.05 %  Crea (Clotrimazole-Betamethasone) .... Aaa As Directed 10)  Estradiol 1 Mg Tabs (Estradiol) .... Take 1 Tablet By Mouth Once A Day 11)  Estradiol 0.5 Mg Tabs (Estradiol) .Marland Kitchen.. 1 Tab By Mouth Daily Along With 1 Mg Tab 12)  Simvastatin 40 Mg Tabs (Simvastatin) .... Take 1 Tablet By Mouth Once A Day  Allergies (verified): No Known Drug Allergies  Past History:  Past medical, surgical, family and social histories (including risk factors) reviewed, and no changes noted (except as noted below).  Past Medical History:    Reviewed history from 05/18/2007 and no changes required:    Anemia-iron deficiency    Depression    Allergic rhinitis    Hyperlipidemia  Past Surgical History:    Reviewed history from 06/08/2007 and no changes required:    1994 hysterectomy, vaginal, no cervix, still has ovaries    24 hour hear tmonitor neg    1973 pilonidal cyst removal  [id number],9604 C section    1986 cone biopsy of cervix ;     1994 exp lap for mennorhagia, no endometriosis    2003 elbow surgery for chronic tendonitis    Tonsillectomy    2008 breast needle biopsy at [facility]: fibrocystic disease  Family History:    Reviewed history from 05/18/2007 and no changes required:       mother: high chol       father: arthritis, CAD, Mi at age 17       Family History of CAD Female 1st degree relative <65       sister: HTN, high chol, hypothyroid       aunt: hypothyroid       no cancer       PGF: carotid artery blockage  Social History:    Reviewed history from 05/18/2007 and no changes required:       Occupation: housewife       Married       2 children: healthy       Former  Smoker 20 pack year history, Quit 9/08       Alcohol use-yes, wine, cocktails on weekends       Drug use-yes, remote marijuana       Regular exercise-yes, 3 x a week       Diet: increasing fish, lean meats, low carb  Review of Systems General:  Complains of fatigue. CV:  Denies chest pain or discomfort. Resp:  Denies shortness of breath. GI:  Denies abdominal pain, bloody stools, constipation, and diarrhea. GU:  Denies dysuria. Derm:  Denies lesion(s). Psych:  Complains of easily angered, easily tearful, and irritability; denies anxiety and depression.  Physical Exam  General:  Well-developed,well-nourished,in no acute distress; alert,appropriate and cooperative throughout examination Nose:  nasal dischargemucosal pallor.   Mouth:  Oral mucosa and oropharynx without lesions or exudates.  Teeth in good repair. Neck:  no carotid bruit or thyromegaly no cervical or supraclavicular lymphadenopathy  Chest Wall:  No deformities, masses, or tenderness noted. Breasts:  No mass, nodules, thickening, tenderness, bulging, retraction, inflamation, nipple discharge or skin changes noted.   Lungs:  Normal respiratory effort, chest expands symmetrically. Lungs are clear to auscultation, no crackles or wheezes. Heart:  Normal rate and regular rhythm. S1 and S2 normal without gallop, murmur, click, rub or other extra sounds. Rectal:  no indicated Genitalia:  not indicated Pulses:  R and L posterior tibial pulses are full and equal bilaterally  Extremities:  no edema Skin:  excessive tan (counseled pt on using sunscreen to decrease risk on melanoma) Psych:  Cognition and judgment appear intact. Alert and cooperative with normal attention span and concentration. No apparent delusions, illusions, hallucinations   Impression & Recommendations:  Problem # 1:  Preventive Health Care (ICD-V70.0) Reviewed preventive care protocols, scheduled due services, and updated immunizations. Encouraged exercise,  weight maintanance, healthy eating habits.   Problem # 2:  HYPERLIPIDEMIA (ICD-272.4)  Restart cholesterol medicaton given good diet, weight and exercsiing frequently. Recheck fasting lipids, AST, ALT  in 3 months Dx 272.0    Her updated medication list for this problem includes:    Simvastatin 40 Mg Tabs (Simvastatin) .Marland Kitchen... Take 1 tablet by mouth once a day  Labs Reviewed: SGOT: 18 (05/31/2008)   SGPT: 17 (05/31/2008)   HDL:73.20 (05/31/2008), 95.2 (05/22/2007)  LDL:DEL (05/22/2007)  Chol:259 (05/31/2008), 237 (05/22/2007)  Trig:87.0 (05/31/2008), 69 (05/22/2007)  Problem # 3:  POSTMENOPAUSAL STATUS (ICD-V49.81) Increase estradiol..to 1.5 mg daily.  Pt understands  risks of HRT in CAD. if no improviemnt consider SSRI.   Complete Medication List: 1)  Cal/mag Citrate 250-125 Mg Tabs (Calcium-magnesium) .... Take 1 tablet by mouth two times a day 2)  Gnp Flax Seed Oil 1000 Mg Caps (Flaxseed (linseed)) .... Take 1 capsule by mouth once a day 3)  Milk Thistle 140 Mg Caps (Milk thistle) .... Take 1 capsule by mouth once a day 4)  Cvs Vitamin B-6 100 Mg Tabs (Pyridoxine hcl) .... Take 1 tablet by mouth once a day 5)  Vitamin C 500 Mg Tabs (Ascorbic acid) .... Take 1 tablet by mouth once a day 6)  Vitamin D 400 Unit Caps (Cholecalciferol) .... Take 1 capsule by mouth once a day 7)  Valtrex 1 Gm Tabs (Valacyclovir hcl) .Marland Kitchen.. 1 tab by mouth twice daily x 1 day as needed herpes out break 8)  Nasonex 50 Mcg/act Susp (Mometasone furoate) .... 2 sprays per nostril daily as needed 9)  Clotrimazole-betamethasone 1-0.05 % Crea (Clotrimazole-betamethasone) .... Aaa as directed 10)  Estradiol 1 Mg Tabs (Estradiol) .... Take 1 tablet by mouth once a day 11)  Estradiol 0.5 Mg Tabs (Estradiol) .Marland Kitchen.. 1 tab by mouth daily along with 1 mg tab 12)  Simvastatin 40 Mg Tabs (Simvastatin) .... Take 1 tablet by mouth once a day  Patient Instructions: 1)  Recheck fasting lipids, AST, ALT  in 3 months Dx 272.0      Prescriptions: SIMVASTATIN 40 MG TABS (SIMVASTATIN) Take 1 tablet by mouth once a day  #30 x 11   Entered and Authorized by:   Kerby Nora MD   Signed by:   Kerby Nora MD on 06/26/2008   Method used:   Electron[facility] to        [id number]       [address]       [place], [place]  [address]       Ph: [phone]       Fax: [phone]   RxID:   [id number] CLOTRIMAZOLE-BETAMETHASONE 1-0.05 %  CREA (CLOTRIMAZOLE-BETAMETHASONE) AAA as directed  #1 x 0   Entered and Authorized by:   Kerby Nora MD   Signed by:   Kerby Nora MD on 06/26/2008   Method used:   Electronically to        [id number]       [address]       [place], [place]  [address]       Ph: [phone]       Fax: [phone]   RxID:   [id number] VALTREX 1 GM  TABS (VALACYCLOVIR HCL) 1 tab by mouth twice daily x 1 day as needed herpes out break  #4 x 3   Entered and Authorized by:   Kerby Nora MD   Signed by:   Kerby Nora MD on 06/26/2008   Method used:   Electronically to        [id number]       [address]       [place], [place]  [address]       Ph: [phone]       Fax: [phone]   RxID:   [id number] ESTRADIOL 0.5 MG TABS (ESTRADIOL) 1 tab by mouth daily along with 1 mg tab  #30 x 11   Entered and Authorized by:   Kerby Nora MD   Signed by:   Kerby Nora MD on 06/26/2008   Method used:  Electronically to        [id number]       [address]       [place], [place]  [address]       Ph: [phone]       Fax: [phone]   RxID:   [id number]       Current Allergies (reviewed today): No known allergies  Last PAP:  Normal (05/14/2006 2:48:01 PM) PAP Next Due:  3 yr NO PAP , DVE  Appended Document: Imaging        Current Allergies: No known allergies         Complete Medication List: 1)  Cal/mag Citrate 250-125 Mg Tabs (Calcium-magnesium) ....  Take 1 tablet by mouth two times a day 2)  Gnp Flax Seed Oil 1000 Mg Caps (Flaxseed (linseed)) .... Take 1 capsule by mouth once a day 3)  Milk Thistle 140 Mg Caps (Milk thistle) .... Take 1 capsule by mouth once a day 4)  Cvs Vitamin B-6 100 Mg Tabs (Pyridoxine hcl) .... Take 1 tablet by mouth once a day 5)  Vitamin C 500 Mg Tabs (Ascorbic acid) .... Take 1 tablet by mouth once a day 6)  Vitamin D 400 Unit Caps (Cholecalciferol) .... Take 1 capsule by mouth once a day 7)  Valtrex 1 Gm Tabs (Valacyclovir hcl) .Marland Kitchen.. 1 tab by mouth twice daily x 1 day as needed herpes out break 8)  Nasonex 50 Mcg/act Susp (Mometasone furoate) .... 2 sprays per nostril daily as needed 9)  Clotrimazole-betamethasone 1-0.05 % Crea (Clotrimazole-betamethasone) .... Aaa as directed 10)  Estradiol 1 Mg Tabs (Estradiol) .... Take 1 tablet by mouth once a day 11)  Estradiol 0.5 Mg Tabs (Estradiol) .Marland Kitchen.. 1 tab by mouth daily along with 1 mg tab 12)  Simvastatin 40 Mg Tabs (Simvastatin) .... Take 1 tablet by mouth once a day    ] Last Colonoscopy:  Normal (07/14/2006 2:48:31 PM) Colonoscopy Next Due:  5 yr Notify pt last colonoscopy in 07/2006.Marland KitchenMarland Kitchenper Gi records repeat due in 2013. Kerby Nora MD  July 04, 2008 8:16 AM  Left message on voicemail to return call. Lugene Fuquay  July 04, 2008 12:48 PM

## 2010-04-14 NOTE — Progress Notes (Signed)
Summary: RX for Yeasty Cream Please!! ASAP for the [facility]  Phone Note Call from Patient Call back at [employer] [phone]   Caller: Patient Summary of Call: Pt requests an RX [id number] Betamethasone Ditropionate Cream. Seems it is some kind of Yeast cream that her former Doctor prescribed for her and she is out of it and is in desperate need of it. She is aware that you are not in the office today and she hope you will do this first thing in the am because she is going on vacation and she needs it apparently! [facility] if you will!!! Initial call taken by: Mickle Asper,  September 25, 2007 3:57 PM    New/Updated Medications: CLOTRIMAZOLE-BETAMETHASONE 1-0.05 %  CREA (CLOTRIMAZOLE-BETAMETHASONE) AAA as directed   Prescriptions: CLOTRIMAZOLE-BETAMETHASONE 1-0.05 %  CREA (CLOTRIMAZOLE-BETAMETHASONE) AAA as directed  #1 x 0   Entered and Authorized by:   Kerby Nora MD   Signed by:   Kerby Nora MD on 09/26/2007   Method used:   Electronically sent to ...       [id number]       [address]       [place], [place]  [address]       Ph: [phone]       Fax: [phone]   RxID:   [id number]

## 2010-04-14 NOTE — Progress Notes (Signed)
Summary: tramadol  Phone Note Refill Request Message from:  [employer] on January 06, 2010 1:35 PM  Refills Requested: Medication #1:  TRAMADOL HCL 50 MG TABS 1-2 tab by mouth at bedtime as needed pain   Supply Requested: 1 month [facility]   Method Requested: Telephone to Pharmacy Initial call taken by: Benny Lennert CMA Duncan Dull),  January 06, 2010 1:35 PM    Prescriptions: TRAMADOL HCL 50 MG TABS (TRAMADOL HCL) 1-2 tab by mouth at bedtime as needed pain  #30 Tablet x 0   Entered by:   Benny Lennert CMA (AAMA)   Authorized by:   Kerby Nora MD   Signed by:   Benny Lennert CMA (AAMA) on 01/06/2010   Method used:   [facility]ed to ...       [facility]  [address] [id number]       [address]       [place], [place]  [address]       Ph: [phone]       Fax: [phone]   RxID:   [id number] TRAMADOL HCL 50 MG TABS (TRAMADOL HCL) 1-2 tab by mouth at bedtime as needed pain  #30 Tablet x 0   Entered and Authorized by:   Kerby Nora MD   Signed by:   Kerby Nora MD on 01/06/2010   Method used:   Electronically to        [id number]       [address]       [place], [place]  [address]       Ph: [phone]       Fax: [phone]   RxID:   [id number]

## 2010-04-14 NOTE — Progress Notes (Signed)
Summary: tramadol  Phone Note Refill Request Message from:  [employer] on November 03, 2009 11:14 AM  Refills Requested: Medication #1:  TRAMADOL HCL 50 MG TABS 1-2 tab by mouth at bedtime as needed pain [facility]   Method Requested: Electronic Initial call taken by: Benny Lennert CMA Duncan Dull),  November 03, 2009 11:14 AM    Prescriptions: TRAMADOL HCL 50 MG TABS (TRAMADOL HCL) 1-2 tab by mouth at bedtime as needed pain  #30 Tablet x 0   Entered by:   Benny Lennert CMA (AAMA)   Authorized by:   Hannah Beat MD   Signed by:   Benny Lennert CMA (AAMA) on 11/03/2009   Method used:   Electronically to        [id number]       [address]       [place], [place]  [address]       Ph: [phone]       Fax: [phone]   RxID:   [id number] TRAMADOL HCL 50 MG TABS (TRAMADOL HCL) 1-2 tab by mouth at bedtime as needed pain  #30 Tablet x 0   Entered and Authorized by:   Hannah Beat MD   Signed by:   Hannah Beat MD on 11/03/2009   Method used:   Telephoned to ...       [facility]  [address] [id number]       [address]       [place], [place]  [address]       Ph: [phone]       Fax: [phone]   RxID:   [id number]

## 2010-04-14 NOTE — Progress Notes (Signed)
Summary: wants referral   Phone Note Call from Patient Call back at Home Phone [phone]   Caller: Patient Call For: Kerby Nora MD Summary of Call: Patient is asking for referral to the [facility]. She had her last mammogram last spring. Please advise.  Initial call taken by: Melody Comas,  Jul 16, 2009 4:08 PM  Follow-up for Phone Call        [facility] on 08/14/2009 at 2:00pm.

## 2010-04-14 NOTE — Progress Notes (Signed)
Summary: tramodol  Phone Note Refill Request Message from:  [employer] on Jul 14, 2009 9:23 AM  Refills Requested: Medication #1:  TRAMADOL HCL 50 MG TABS 1-2 tab by mouth at bedtime as needed pain.   Supply Requested: 1 month [facility]   Method Requested: Electronic Initial call taken by: Benny Lennert CMA Duncan Dull),  Jul 14, 2009 9:23 AM  Follow-up for Phone Call        Refill for 1 month..make sure pt has appt for CPX.Marland Kitchenwas due 06/26/2009. Follow-up by: Kerby Nora MD,  Jul 14, 2009 11:40 AM  Additional Follow-up for Phone Call Additional follow up Details #1::        Patient has cpx in june so refilled 30 with 0 Additional Follow-up by: Benny Lennert CMA Duncan Dull),  Jul 14, 2009 12:42 PM

## 2010-04-14 NOTE — Progress Notes (Signed)
  Phone Note Call from Patient   Caller: Patient Summary of Call: PLEASE PUT MMG IN FOR THE PT SHE IS DUE AT [facility] Initial call taken by: Carlton Adam,  January 25, 2008 5:08 PM  Follow-up for Phone Call        MMG SET UP AT  [facility] AND ORDER MAILED TO PT. Follow-up by: Carlton Adam,  January 26, 2008 4:32 PM  New Problems: OTHER SCREENING MAMMOGRAM (ICD-V76.12)   New Problems: OTHER SCREENING MAMMOGRAM (ICD-V76.12)

## 2010-04-14 NOTE — Progress Notes (Signed)
Summary: requests valtrex refill  Phone Note Refill Request Call back at [phone] Message from:  Patient  Refills Requested: Medication #1:  VALTREX 1 GM  TABS as needed herpes out break Pt is asking for refill on valtrex for fever blisters, she said you have never filled this for her before, has been getting from Dr. Ramiro Harvest.  Please send to [facility].  Initial call taken by: Lowella Petties,  December 25, 2007 9:18 AM    New/Updated Medications: VALTREX 1 GM  TABS (VALACYCLOVIR HCL) 1 tab po twice daily x 1 day as needed herpes out break   Prescriptions: VALTREX 1 GM  TABS (VALACYCLOVIR HCL) 1 tab po twice daily x 1 day as needed herpes out break  #2 x 5   Entered and Authorized by:   Kerby Nora MD   Signed by:   Kerby Nora MD on 12/25/2007   Method used:   Electronically to        [id number]       [address]       [place], [place]  [address]       Ph: [phone]       Fax: [phone]   RxID:   [id number]

## 2010-04-14 NOTE — Letter (Signed)
Summary: Dr.Jeffries,[facility],Records  Dr.Jeffries,[facility],Records   Imported By: Beau Fanny 06/08/2007 14:44:37  _____________________________________________________________________  External Attachment:    Type:   Image     Comment:   External Document

## 2010-04-14 NOTE — Letter (Signed)
Summary: Results Follow up Letter  [place] [facility]  [address] [place], [place] [address]   Phone: [phone]  Fax: [phone]    03/25/2008 MRN: [id number]    [facility] [address] [place], [place]  [address]    Dear Ms. Lascano,  The following are the results of your recent test(s):  Test         Result    Pap Smear:        Normal _____  Not Normal _____ Comments: ______________________________________________________ Cholesterol: LDL(Bad cholesterol):         Your goal is less than:         HDL (Good cholesterol):       Your goal is more than: Comments:  ______________________________________________________ Mammogram:        Normal __X__  Not Normal _____ Comments:  Routine yearly follow up is recommended.  You will be due again: 1 year  ___________________________________________________________________ Hemoccult:        Normal _____  Not normal _______ Comments:    _____________________________________________________________________ Other Tests:    We routinely do not discuss normal results over the telephone.  If you desire a copy of the results, or you have any questions about this information we can discuss them at your next office visit.   Sincerely,    Excell Seltzer, M.D.  AEB:lsf

## 2010-04-14 NOTE — Progress Notes (Signed)
  Phone Note Refill Request Message from:  [employer] on April 04, 2009 7:52 AM  Refills Requested: Medication #1:  TRAMADOL HCL 50 MG TABS 1-2 tab by mouth at bedtime as needed pain.   Supply Requested: 1 month [facility]   Method Requested: Electronic Initial call taken by: Benny Lennert CMA Duncan Dull),  April 04, 2009 7:53 AM  Follow-up for Phone Call        rx called to pharmacy Follow-up by: Benny Lennert CMA Duncan Dull),  April 04, 2009 10:29 AM    Prescriptions: TRAMADOL HCL 50 MG TABS (TRAMADOL HCL) 1-2 tab by mouth at bedtime as needed pain  #30 Tablet x 0   Entered and Authorized by:   Kerby Nora MD   Signed by:   Kerby Nora MD on 04/04/2009   Method used:   Telephoned to ...       [facility]  [address] [id number]       [address]       [place], [place]  [address]       Ph: [phone]       Fax: [phone]   RxID:   [id number]

## 2010-04-14 NOTE — Assessment & Plan Note (Signed)
Summary: TMJ PAIN/MK   Vital Signs:  Patient profile:   55 year old female Height:      63 inches Weight:      133.0 pounds BMI:     23.65 Temp:     98.3 degrees F oral Pulse rate:   76 / minute Pulse rhythm:   regular BP sitting:   98 / 60  (left arm) Cuff size:   regular  Vitals Entered By: Benny Lennert CMA Duncan Dull) (January 16, 2009 2:37 PM)  History of Present Illness: Chief complaint tjm  3 weeks of right jaw pain. radiates to right temple. occ headhaces associated.  Has felt some popping in TMJ with opening mouth wide. Seen at dentist..X-rays normal. He felt due to TMJ. Wears oral gaurd for grinding teeth.  Using ibuprofen using quite a lot of ibuprofe, has tried naproxen...some stomach distress. pain waking her up at night.   Has an appt on 11/15 with oral surgeon. Today she is requestin pain medicaion or valium to help sleep at night.   No fatigue, no visual change.   Problems Prior to Update: 1)  Postmenopausal Status  (ICD-V49.81) 2)  Other Screening Mammogram  (ICD-V76.12) 3)  Well Woman  (ICD-V70.0) 4)  Routine Gynecological Examination  (ICD-V72.31) 5)  Herpes Simplex Infection  (ICD-054.9) 6)  Family History of Cad Female 1st Degree Relative <50  (ICD-V17.3) 7)  Palpitations, Recurrent  (ICD-785.1) 8)  Hyperlipidemia  (ICD-272.4) 9)  Benign Positional Vertigo  (ICD-386.11) 10)  Allergic Rhinitis  (ICD-477.9) 11)  Depression  (ICD-311) 12)  Anemia-iron Deficiency  (ICD-280.9)  Current Medications (verified): 1)  Cal/mag Citrate 250-125 Mg  Tabs (Calcium-Magnesium) .... Take 1 Tablet By Mouth Two Times A Day 2)  Gnp Flax Seed Oil 1000 Mg  Caps (Flaxseed (Linseed)) .... Take 1 Capsule By Mouth Once A Day 3)  Milk Thistle 140 Mg  Caps (Milk Thistle) .... Take 1 Capsule By Mouth Once A Day 4)  Cvs Vitamin B-6 100 Mg  Tabs (Pyridoxine Hcl) .... Take 1 Tablet By Mouth Once A Day 5)  Vitamin C 500 Mg  Tabs (Ascorbic Acid) .... Take 1 Tablet By Mouth Once  A Day 6)  Vitamin D 400 Unit  Caps (Cholecalciferol) .... Take 1 Capsule By Mouth Once A Day 7)  Valtrex 1 Gm  Tabs (Valacyclovir Hcl) .Marland Kitchen.. 1 Tab By Mouth Twice Daily X 1 Day As Needed Herpes Out Break 8)  Nasonex 50 Mcg/act  Susp (Mometasone Furoate) .... 2 Sprays Per Nostril Daily As Needed 9)  Clotrimazole-Betamethasone 1-0.05 %  Crea (Clotrimazole-Betamethasone) .... Aaa As Directed 10)  Estradiol 1 Mg Tabs (Estradiol) .... Take 1 Tablet By Mouth Once A Day 11)  Estradiol 0.5 Mg Tabs (Estradiol) .Marland Kitchen.. 1 Tab By Mouth Daily Along With 1 Mg Tab 12)  Simvastatin 40 Mg Tabs (Simvastatin) .... Take 1 Tablet By Mouth Once A Day 13)  Diclofenac Sodium 75 Mg Tbec (Diclofenac Sodium) .Marland Kitchen.. 1 Tab By Mouth Two Times A Day, If Stomach Irritated May Use Prilosec 14)  Tramadol Hcl 50 Mg Tabs (Tramadol Hcl) .Marland Kitchen.. 1-2 Tab By Mouth At Bedtime As Needed Pain  Allergies (verified): No Known Drug Allergies  Past History:  Past medical, surgical, family and social histories (including risk factors) reviewed, and no changes noted (except as noted below).  Past Medical History: Reviewed history from 05/18/2007 and no changes required. Anemia-iron deficiency Depression Allergic rhinitis Hyperlipidemia  Past Surgical History: Reviewed history from 06/08/2007 and no changes required.  1994 hysterectomy, vaginal, no cervix, still has ovaries 24 hour hear tmonitor neg 1973 pilonidal cyst removal 1990,1993 C section 1986 cone biopsy of cervix ;  1994 exp lap for mennorhagia, no endometriosis 2003 elbow surgery for chronic tendonitis Tonsillectomy 2008 breast needle biopsy at [facility]: fibrocystic disease  Family History: Reviewed history from 05/18/2007 and no changes required. mother: high chol father: arthritis, CAD, Mi at age 77 Family History of CAD Female 1st degree relative <65 sister: HTN, high chol, hypothyroid aunt: hypothyroid no cancer PGF: carotid artery blockage  Social History: Reviewed  history from 05/18/2007 and no changes required. 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 EOMI. Perrla. Funduscopic exam benign, without hemorrhages, exudates or papilledema. Vision grossly normal. Ears:  External ear exam shows no significant lesions or deformities.  Otoscopic examination reveals clear canals, tympanic membranes are intact bilaterally without bulging, retraction, inflammation or discharge. Hearing is grossly normal bilaterally. Nose:  External nasal examination shows no deformity or inflammation. Nasal mucosa are pink and moist without lesions or exudates. Mouth:  MMM right temple and TMJ joint tender as well as tender over right maxilla and lower jaws Neck:  no carotid bruit or thyromegaly no cervical or supraclavicular lymphadenopathy  Lungs:  Normal respiratory effort, chest expands symmetrically. Lungs are clear to auscultation, no crackles or wheezes. Heart:  Normal rate and regular rhythm. S1 and S2 normal without gallop, murmur, click, rub or other extra sounds. Msk:  No deformity or scoliosis noted of thoracic or lumbar spine.   Strength in upper and lower ext nml.    Impression & Recommendations:  Problem # 1:  JAW PAIN (ICD-526.9) Fairly significant pain, slightly atypical for TMJ...no TMJ popping.  No clear direct suggestion of temporal arteritis, she is somewhat young for this, but will chceck sed rate to eval. Orders: TLB-Sedimentation Rate (ESR) (85652-ESR)  Complete Medication List: 1)  Cal/mag Citrate 250-125 Mg Tabs (Calcium-magnesium) .... Take 1 tablet by  mouth two times a day 2)  Gnp Flax Seed Oil 1000 Mg Caps (Flaxseed (linseed)) .... Take 1 capsule by mouth once a day 3)  Milk Thistle 140 Mg Caps (Milk thistle) .... Take 1 capsule by mouth once a day 4)  Cvs Vitamin B-6 100 Mg Tabs (Pyridoxine hcl) .... Take 1 tablet by mouth once a day 5)  Vitamin C 500 Mg Tabs (Ascorbic acid) .... Take 1 tablet by mouth once a day 6)  Vitamin D 400 Unit Caps (Cholecalciferol) .... Take 1 capsule by mouth once a day 7)  Valtrex 1 Gm Tabs (Valacyclovir hcl) .Marland Kitchen.. 1 tab by mouth twice daily x 1 day as needed herpes out break 8)  Nasonex 50 Mcg/act Susp (Mometasone furoate) .... 2 sprays per nostril daily as needed 9)  Clotrimazole-betamethasone 1-0.05 % Crea (Clotrimazole-betamethasone) .... Aaa as directed 10)  Estradiol 1 Mg Tabs (Estradiol) .... Take 1 tablet by mouth once a day 11)  Estradiol 0.5 Mg Tabs (Estradiol) .Marland Kitchen.. 1 tab by mouth daily along with 1 mg tab 12)  Simvastatin 40 Mg Tabs (Simvastatin) .... Take 1 tablet by mouth once a day 13)  Diclofenac Sodium 75 Mg Tbec (Diclofenac sodium) .Marland Kitchen.. 1 tab by mouth two times  a day, if stomach irritated may use prilosec 14)  Tramadol Hcl 50 Mg Tabs (Tramadol hcl) .Marland Kitchen.. 1-2 tab by mouth at bedtime as needed pain  Patient Instructions: 1)  TMJ much  less likely temporal arteritis 2)  Use diclofenac two times a day, tramadol as needed pain breakthru at night. Prescriptions: TRAMADOL HCL 50 MG TABS (TRAMADOL HCL) 1-2 tab by mouth at bedtime as needed pain  #20 x 0   Entered and Authorized by:   Kerby Nora MD   Signed by:   Kerby Nora MD on 01/16/2009   Method used:   Print then Give to Patient   RxID:   [id number] DICLOFENAC SODIUM 75 MG TBEC (DICLOFENAC SODIUM) 1 tab by mouth two times a day, if stomach irritated may use prilosec  #30 x 0   Entered and Authorized by:   Kerby Nora MD   Signed by:   Kerby Nora MD on 01/16/2009   Method used:   Print then Give to Patient   RxID:    [id number]   Current Allergies (reviewed today): No known allergies

## 2010-04-14 NOTE — Letter (Signed)
Summary: Results Follow up Letter  [place] [facility]  [address] [place], [place] [address]   Phone: [phone]  Fax: [phone]    08/25/2009 MRN: [id number]    [facility] [address] [place], [place]  [address]    Dear Ms. Volkov,  The following are the results of your recent test(s):  Test         Result    Pap Smear:        Normal _____  Not Normal _____ Comments: ______________________________________________________ Cholesterol: LDL(Bad cholesterol):         Your goal is less than:         HDL (Good cholesterol):       Your goal is more than: Comments:  ______________________________________________________ Mammogram:        Normal __X___  Not Normal _____ Comments:  Yearly follow up is recommended.   ___________________________________________________________________ Hemoccult:        Normal _____  Not normal _______ Comments:    _____________________________________________________________________ Other Tests:    We routinely do not discuss normal results over the telephone.  If you desire a copy of the results, or you have any questions about this information we can discuss them at your next office visit.   Sincerely,    Kerby Nora, M.D.  AB:lsf

## 2010-04-14 NOTE — Progress Notes (Signed)
  Phone Note Refill Request Message from:  Scriptline on February 04, 2009 10:54 AM  Refills Requested: Medication #1:  DICLOFENAC SODIUM 75 MG TBEC 1 tab by mouth two times a day   Supply Requested: 1 month  Medication #2:  TRAMADOL HCL 50 MG TABS 1-2 tab by mouth at bedtime as needed pain.   Supply Requested: 1 month  Method Requested: Electronic Initial call taken by: Benny Lennert CMA Duncan Dull),  February 04, 2009 10:54 AM    Prescriptions: TRAMADOL HCL 50 MG TABS (TRAMADOL HCL) 1-2 tab by mouth at bedtime as needed pain  #30 x 0   Entered and Authorized by:   Hannah Beat MD   Signed by:   Hannah Beat MD on 02/04/2009   Method used:   Electronically to        [id number]       [address]       [place], [place]  [address]       Ph: [phone]       Fax: [phone]   RxID:   [id number] DICLOFENAC SODIUM 75 MG TBEC (DICLOFENAC SODIUM) 1 tab by mouth two times a day, if stomach irritated may use prilosec  #30 x 0   Entered and Authorized by:   Hannah Beat MD   Signed by:   Hannah Beat MD on 02/04/2009   Method used:   Electronically to        [id number]       [address]       [place], [place]  [address]       Ph: [phone]       Fax: [phone]   RxID:   [id number]

## 2010-04-14 NOTE — Progress Notes (Signed)
Summary: tramadol  Phone Note Refill Request Message from:  Scriptline on December 10, 2009 8:40 AM  Refills Requested: Medication #1:  TRAMADOL HCL 50 MG TABS 1-2 tab by mouth at bedtime as needed pain   Supply Requested: 3 months [facility] [address]    Method Requested: Electronic Initial call taken by: Benny Lennert CMA (AAMA),  December 10, 2009 8:40 AM    Prescriptions: TRAMADOL HCL 50 MG TABS (TRAMADOL HCL) 1-2 tab by mouth at bedtime as needed pain  #30 Tablet x 0   Entered by:   Benny Lennert CMA (AAMA)   Authorized by:   Kerby Nora MD   Signed by:   Benny Lennert CMA (AAMA) on 12/10/2009   Method used:   [facility]ed [facility] ...       [facility]  [address] [id number]       [address]       [place], [place]  [address]       Ph: [phone]       Fax: [phone]   RxID:   [id number] TRAMADOL HCL 50 MG TABS (TRAMADOL HCL) 1-2 tab by mouth at bedtime as needed pain  #30 Tablet x 0   Entered and Authorized by:   Kerby Nora MD   Signed by:   Kerby Nora MD on 12/10/2009   Method used:   Telephoned to ...       [facility]  [address] [id number]       [address]       [place], [place]  [address]       Ph: [phone]       Fax: [phone]   RxID:   [id number]

## 2010-04-14 NOTE — Progress Notes (Signed)
Summary: tramodol  Phone Note Refill Request Message from:  Scriptline on June 04, 2009 2:03 PM  Refills Requested: Medication #1:  TRAMADOL HCL 50 MG TABS 1-2 tab by mouth at bedtime as needed pain.   Supply Requested: 1 month [facility] [address]   Method Requested: Electronic Initial call taken by: Benny Lennert CMA Duncan Dull),  June 04, 2009 2:04 PM  Follow-up for Phone Call        Rx called to pharmacy Follow-up by: Benny Lennert CMA Duncan Dull),  June 05, 2009 1:02 PM    Prescriptions: TRAMADOL HCL 50 MG TABS (TRAMADOL HCL) 1-2 tab by mouth at bedtime as needed pain  #30 Tablet x 0   Entered and Authorized by:   Kerby Nora MD   Signed by:   Kerby Nora MD on 06/05/2009   Method used:   Telephoned to ...       [facility]  [address] [id number]       [address]       [place], [place]  [address]       Ph: [phone]       Fax: [phone]   RxID:   [id number]

## 2010-04-14 NOTE — Procedures (Signed)
Summary: Colonoscopy by Dr.Drew Henrene Hawking  Colonoscopy by Dr.Drew Henrene Hawking   Imported By: Beau Fanny 07/04/2008 09:54:30  _____________________________________________________________________  External Attachment:    Type:   Image     Comment:   External Document

## 2010-04-14 NOTE — Assessment & Plan Note (Signed)
Summary: CPX/CLE   Vital Signs:  Patient profile:   55 year old female Height:      63 inches Weight:      132.8 pounds BMI:     23.61 Temp:     97.9 degrees F oral Pulse rate:   76 / minute Pulse rhythm:   regular BP sitting:   102 / 60  (left arm) Cuff size:   regular  Vitals Entered By: Benny Lennert CMA Duncan Dull) (September 09, 2009 2:39 PM) CC: cpx     Last PAP Date 09/09/2009 Last PAP Result DVE no pap..s/p hys no cervix  but ovaries remain   History of Present Illness: The patient is here for annual wellness exam and preventative care.     TMJ, moderate improvement..has an orhtodontic plate. Using tramadol intermittantly at night for jaw pain. Using every 3 days.  HRT..much imrpoved symptoms on higher dose.   High cholesterol...no SE on simvastatin.     Problems Prior to Update: 1)  Other Malaise and Fatigue  (ICD-780.79) 2)  Jaw Pain  (ICD-526.9) 3)  Postmenopausal Status  (ICD-V49.81) 4)  Other Screening Mammogram  (ICD-V76.12) 5)  Well Woman  (ICD-V70.0) 6)  Routine Gynecological Examination  (ICD-V72.31) 7)  Herpes Simplex Infection  (ICD-054.9) 8)  Family History of Cad Female 1st Degree Relative <50  (ICD-V17.3) 9)  Palpitations, Recurrent  (ICD-785.1) 10)  Hyperlipidemia  (ICD-272.4) 11)  Benign Positional Vertigo  (ICD-386.11) 12)  Allergic Rhinitis  (ICD-477.9) 13)  Depression  (ICD-311) 14)  Anemia-iron Deficiency  (ICD-280.9)  Current Medications (verified): 1)  Valtrex 1 Gm  Tabs (Valacyclovir Hcl) .Marland Kitchen.. 1 Tab By Mouth Twice Daily X 1 Day As Needed Herpes Out Break 2)  Estradiol 1 Mg Tabs (Estradiol) .... Take 1 Tablet By Mouth Once A Day 3)  Estradiol 0.5 Mg Tabs (Estradiol) .Marland Kitchen.. 1 Tab By Mouth Daily Along With 1 Mg Tab 4)  Simvastatin 40 Mg Tabs (Simvastatin) .... Take 1 Tablet By Mouth Once A Day 5)  Tramadol Hcl 50 Mg Tabs (Tramadol Hcl) .Marland Kitchen.. 1-2 Tab By Mouth At Bedtime As Needed Pain  Allergies (verified): No Known Drug Allergies  Past  History:  Past medical, surgical, family and social histories (including risk factors) reviewed, and no changes noted (except as noted below).  Past Medical History: Reviewed history from 05/18/2007 and no changes required. Anemia-iron deficiency Depression Allergic rhinitis Hyperlipidemia  Past Surgical History: Reviewed history from 06/08/2007 and no changes required. 1994 hysterectomy, vaginal, no cervix, still has ovaries 24 hour hear tmonitor neg 1973 pilonidal cyst removal 1990,1993 C section 1986 cone biopsy of cervix ;  1994 exp lap for mennorhagia, no endometriosis 2003 elbow surgery for chronic tendonitis Tonsillectomy 2008 breast needle biopsy at [facility]: fibrocystic disease  Family History: Reviewed history from 05/18/2007 and no changes required. mother: high chol father: arthritis, CAD, Mi at age 63 Family History of CAD Female 1st degree relative <65 sister: HTN, high chol, hypothyroid aunt: hypothyroid no cancer PGF: carotid artery blockage  Social History: Reviewed history from 05/18/2007 and no changes required. 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 CV:  Denies chest pain or discomfort and swelling of feet. Resp:  Denies shortness of breath. GI:  Denies abdominal pain. GU:  Denies dysuria. Derm:  Denies rash. Psych:  Denies anxiety and depression.  Physical Exam  General:  Well-developed,well-nourished,in no acute distress; alert,appropriate and cooperative throughout examination Ears:  External ear exam shows no significant lesions or deformities.  Otoscopic examination reveals clear canals, tympanic membranes are intact bilaterally without bulging, retraction, inflammation or  discharge. Hearing is grossly normal bilaterally. Nose:  External nasal examination shows no deformity or inflammation. Nasal mucosa are pink and moist without lesions or exudates. Mouth:  Oral mucosa and oropharynx without lesions or exudates.  Teeth in good repair. Neck:  No deformities, masses, or tenderness noted. Chest Wall:  No deformities, masses, or tenderness noted. Breasts:  No mass, nodules, thickening, tenderness, bulging, retraction, inflamation, nipple discharge or skin changes noted.   Lungs:  Normal respiratory effort, chest expands symmetrically. Lungs are clear to auscultation, no crackles or wheezes. Heart:  Normal rate and regular rhythm. S1 and S2 normal without gallop, murmur, click, rub or other extra sounds. Abdomen:  Bowel sounds positive,abdomen soft and non-tender without masses, organomegaly or hernias noted. Genitalia:  Pelvic Exam:        External: normal female genitalia without lesions or masses        Vagina: normal without lesions or masses        Cervix: none         Adnexa: normal bimanual exam without masses or fullness        Uterus: none        Pap smear: not performed Pulses:  R and L posterior tibial pulses are full and equal bilaterally  Extremities:  no edema Skin:  Intact without suspicious lesions or rashes Psych:  Cognition and judgment appear intact. Alert and cooperative with normal attention span and concentration. No apparent delusions, illusions, hallucinations   Impression & Recommendations:  Problem # 1:  WELL WOMAN (ICD-V70.0) The patient's preventative maintenance and recommended screening tests for an annual wellness exam were reviewed in full today. Brought up to date unless services declined.  Counselled on the importance of diet, exercise, and its role in overall health and mortality. The patient's FH and SH was reviewed, including their home life, tobacco status, and drug and alcohol status.     Problem # 2:  ROUTINE  GYNECOLOGICAL EXAMINATION (ICD-V72.31) DVE no pap.  Complete Medication List: 1)  Valtrex 1 Gm Tabs (Valacyclovir hcl) .Marland Kitchen.. 1 tab by mouth twice daily x 1 day as needed herpes out break 2)  Estradiol 1 Mg Tabs (Estradiol) .... Take 1 tablet by mouth once a day 3)  Estradiol 0.5 Mg Tabs (Estradiol) .Marland Kitchen.. 1 tab by mouth daily along with 1 mg tab 4)  Simvastatin 40 Mg Tabs (Simvastatin) .... Take 1 tablet by mouth once a day 5)  Tramadol Hcl 50 Mg Tabs (Tramadol hcl) .Marland Kitchen.. 1-2 tab by mouth at bedtime as needed pain  Patient Instructions: 1)  Please schedule a follow-up appointment in 1 year.  Prescriptions: ESTRADIOL 1 MG TABS (ESTRADIOL) Take 1 tablet by mouth once a day  #90 x 3   Entered and Authorized by:   Kerby Nora MD   Signed by:   Kerby Nora MD on 09/09/2009   Method used:   Electronically to        [id number]       [address]       [place], [place]  [address]       Ph: [phone]       Fax: [phone]   RxID:  [id number] ESTRADIOL 0.5 MG TABS (ESTRADIOL) 1 tab by mouth daily along with 1 mg tab  #90 x 3   Entered and Authorized by:   Kerby Nora MD   Signed by:   Kerby Nora MD on 09/09/2009   Method used:   Electronically to        [id number]       [address]       [place], [place]  [address]       Ph: [phone]       Fax: [phone]   RxID:   [id number] SIMVASTATIN 40 MG TABS (SIMVASTATIN) Take 1 tablet by mouth once a day  #90 x 3   Entered and Authorized by:   Kerby Nora MD   Signed by:   Kerby Nora MD on 09/09/2009   Method used:   Electronically to        [id number]       [address]       [place], [place]  [address]       Ph: [phone]       Fax: [phone]   RxID:   [id number] TRAMADOL HCL 50 MG TABS (TRAMADOL HCL) 1-2 tab by mouth at bedtime as needed pain  #30 Tablet x 0   Entered and Authorized by:   Kerby Nora MD   Signed by:   Kerby Nora MD on 09/09/2009   Method used:   Print then Give to Patient   RxID:   [id number]   Current Allergies (reviewed today): No known allergies   Last PAP:  Normal (05/14/2006 2:48:01 PM) PAP Result Date:  09/09/2009 PAP Result:  DVE no pap..s/p hys no cervix  but ovaries remain PAP Next Due:  3 yr    Past Medical History:    Reviewed history from 05/18/2007 and no changes required:       Anemia-iron deficiency       Depression       Allergic rhinitis       Hyperlipidemia  Past Surgical History:    Reviewed history from 06/08/2007 and no changes required:       1994 hysterectomy, vaginal, no cervix, still has ovaries       24 hour hear tmonitor neg       1973 pilonidal cyst removal       1990,1993 C section       1986 cone biopsy of cervix ;        1994 exp lap for mennorhagia, no endometriosis       2003 elbow surgery for chronic tendonitis       Tonsillectomy       2008 breast needle biopsy at [facility]: fibrocystic disease  Appended Document: Orders Update    Clinical Lists Changes  Medications: Added new medication of CLOTRIMAZOLE-BETAMETHASONE 1-0.05 % CREA (CLOTRIMAZOLE-BETAMETHASONE) AAA as directed - Signed Rx of CLOTRIMAZOLE-BETAMETHASONE 1-0.05 % CREA (CLOTRIMAZOLE-BETAMETHASONE) AAA as directed;  #15 gm x 0;  Signed;  Entered by: Kerby Nora MD;  Authorized by: Kerby Nora MD;  Method used: [id number] [address], [place], [place]  [address], Ph: [phone], Fax: [phone]    Prescriptions: CLOTRIMAZOLE-BETAMETHASONE 1-0.05 % CREA (CLOTRIMAZOLE-BETAMETHASONE) AAA as directed  #15 gm x 0   Entered and Authorized by:   Kerby Nora MD   Signed by:   Kerby Nora MD on 09/09/2009   Method used:   [facility]  [facility]  [address] [id number]ail)       [address]       [place], [place]  [address]       Ph: [phone]       Fax: [phone]   RxID:   [id number]

## 2010-04-16 NOTE — Progress Notes (Signed)
Summary: tamadol  Phone Note Refill Request Message from:  Scriptline on April 09, 2010 8:08 AM  Refills Requested: Medication #1:  TRAMADOL HCL 50 MG TABS 1-2 tab by mouth at bedtime as needed pain   Supply Requested: 1 month [facility] [address]   Method Requested: Telephone to Pharmacy Initial call taken by: Benny Lennert CMA (AAMA),  April 09, 2010 8:09 AM    Prescriptions: TRAMADOL HCL 50 MG TABS (TRAMADOL HCL) 1-2 tab by mouth at bedtime as needed pain  #30 Tablet x 0   Entered by:   Benny Lennert CMA (AAMA)   Authorized by:   Kerby Nora MD   Signed by:   Benny Lennert CMA (AAMA) on 04/10/2010   Method used:   Printed then faxed to ...       [facility]  [address] [id number]       [address]       [place], [place]  [address]       Ph: [phone]       Fax: [phone]   RxID:   [id number] TRAMADOL HCL 50 MG TABS (TRAMADOL HCL) 1-2 tab by mouth at bedtime as needed pain  #30 Tablet x 0   Entered and Authorized by:   Kerby Nora MD   Signed by:   Kerby Nora MD on 04/10/2010   Method used:   Telephoned to ...       [facility]  [address] [id number]ail)       [address]       [place], [place]  [address]       Ph: [phone]       Fax: [phone]   RxID:   [id number]  RX FAXED TO PHARMACY.Consuello Masse CMA    Appended Document: tamadol rx faxed to pharmacy through computer

## 2010-04-16 NOTE — Progress Notes (Signed)
Summary: tramadol  Phone Note Refill Request Message from:  [employer] on February 27, 2010 9:12 AM  Refills Requested: Medication #1:  TRAMADOL HCL 50 MG TABS 1-2 tab by mouth at bedtime as needed pain   Supply Requested: 1 month [facility]   Method Requested: Electronic Initial call taken by: Benny Lennert CMA (AAMA),  February 27, 2010 9:12 AM    Prescriptions: TRAMADOL HCL 50 MG TABS (TRAMADOL HCL) 1-2 tab by mouth at bedtime as needed pain  #30 Tablet x 0   Entered by:   Benny Lennert CMA (AAMA)   Authorized by:   Kerby Nora MD   Signed by:   Benny Lennert CMA (AAMA) on 02/27/2010   Method used:   [facility] ...       [facility]  [address] [id number]       [address]       [place], [place]  [address]       Ph: [phone]       Fax: [phone]   RxID:   [id number] TRAMADOL HCL 50 MG TABS (TRAMADOL HCL) 1-2 tab by mouth at bedtime as needed pain  #30 Tablet x 0   Entered and Authorized by:   Kerby Nora MD   Signed by:   Kerby Nora MD on 02/27/2010   Method used:   Telephoned to ...       [facility]  [address] [id number]ail[facility]       [address]       [place], [place]  [address]       Ph: [phone]       Fax: [phone]   RxID:   [id number]

## 2010-04-16 NOTE — Progress Notes (Signed)
Summary: tramadol  Phone Note Refill Request Message from:  [employer] on March 17, 2010 12:27 PM  Refills Requested: Medication #1:  TRAMADOL HCL 50 MG TABS 1-2 tab by mouth at bedtime as needed pain   Supply Requested: 1 month [facility] [address]   Method Requested: Electronic Initial call taken by: Benny Lennert CMA Duncan Dull),  March 17, 2010 12:27 PM  Follow-up for Phone Call        faxed to pharmacy.Consuello Masse CMA   Follow-up by: Benny Lennert CMA Duncan Dull),  March 17, 2010 2:11 PM    Prescriptions: TRAMADOL HCL 50 MG TABS (TRAMADOL HCL) 1-2 tab by mouth at bedtime as needed pain  #30 Tablet x 0   Entered by:   Benny Lennert CMA (AAMA)   Authorized by:   Kerby Nora MD   Signed by:   Benny Lennert CMA (AAMA) on 03/17/2010   Method used:   [facility]ed to ...       [facility]  [id number] ([facility]ail[facility]       [address]       [place], [place]  [address]       Ph: [phone]       Fax: [phone]   RxID:   [id number] TRAMADOL HCL 50 MG TABS (TRAMADOL HCL) 1-2 tab by mouth at bedtime as needed pain  #30 Tablet x 0   Entered and Authorized by:   Kerby Nora MD   Signed by:   Kerby Nora MD on 03/17/2010   Method used:   Telephoned to ...       [facility]  [id number] ([facility]ail)       [address]       [place], [place]  [address]       Ph: [phone]       Fax: [phone]   RxID:   [id number]

## 2010-05-01 ENCOUNTER — Telehealth: Payer: Self-pay | Admitting: Family Medicine

## 2010-05-06 NOTE — Progress Notes (Signed)
Summary: refill request for tramadol  Phone Note Refill Request   Refills Requested: Medication #1:  TRAMADOL HCL 50 MG TABS 1-2 tab by mouth at bedtime as needed pain   Last Refilled: 04/10/2010 Faxed request from [facility].  Initial call taken by: Lowella Petties CMA, AAMA,  May 01, 2010 11:10 AM  Follow-up for Phone Call        Called to [facility]. Follow-up by: Lowella Petties CMA, AAMA,  May 01, 2010 12:26 PM    Prescriptions: TRAMADOL HCL 50 MG TABS (TRAMADOL HCL) 1-2 tab by mouth at bedtime as needed pain  #30 Tablet x 0   Entered and Authorized by:   Kerby Nora MD   Signed by:   Kerby Nora MD on 05/01/2010   Method used:   Telephoned to ...       [facility]  [id number] ([facility]ail)       [address]       [place], [place]  [address]       Ph: [phone]       Fax: [phone]   RxID:   [id number]

## 2010-05-22 ENCOUNTER — Telehealth: Payer: Self-pay | Admitting: Family Medicine

## 2010-05-26 NOTE — Progress Notes (Signed)
Summary: tramadol  Phone Note Refill Request Message from:  [employer] on May 22, 2010 11:39 AM  Refills Requested: Medication #1:  TRAMADOL HCL 50 MG TABS 1-2 tab by mouth at bedtime as needed pain [facility] [address]   Method Requested: Electronic Initial call taken by: Benny Lennert CMA Duncan Dull),  May 22, 2010 11:40 AM  Follow-up for Phone Call        rx sent electonically Follow-up by: Benny Lennert CMA Duncan Dull),  May 22, 2010 1:14 PM    Prescriptions: TRAMADOL HCL 50 MG TABS (TRAMADOL HCL) 1-2 tab by mouth at bedtime as needed pain  #30 Tablet x 0   Entered by:   Benny Lennert CMA (AAMA)   Authorized by:   Kerby Nora MD   Signed by:   Benny Lennert CMA (AAMA) on 05/22/2010   Method used:   Electronically to        [id number]       [address]       [place], [place]  [address]       Ph: [phone]       Fax: [phone]   RxID:   [id number] TRAMADOL HCL 50 MG TABS (TRAMADOL HCL) 1-2 tab by mouth at bedtime as needed pain  #30 Tablet x 0   Entered and Authorized by:   Kerby Nora MD   Signed by:   Kerby Nora MD on 05/22/2010   Method used:   Telephoned to ...       [facility]  [id number] (retail)       [address]       [place], [place]  [address]       Ph: [phone]       Fax: [phone]   RxID:   [id number]

## 2010-06-15 ENCOUNTER — Other Ambulatory Visit: Payer: Self-pay | Admitting: Family Medicine

## 2010-07-07 ENCOUNTER — Other Ambulatory Visit: Payer: Self-pay | Admitting: Family Medicine

## 2010-07-23 ENCOUNTER — Telehealth: Payer: Self-pay | Admitting: *Deleted

## 2010-07-23 DIAGNOSIS — Z1231 Encounter for screening mammogram for malignant neoplasm of breast: Secondary | ICD-10-CM

## 2010-07-23 NOTE — Telephone Encounter (Signed)
Patient says that it is time for her yearly mammogram and needs referral. She goes to [facility].

## 2010-07-28 ENCOUNTER — Other Ambulatory Visit: Payer: Self-pay | Admitting: Family Medicine

## 2010-08-13 ENCOUNTER — Other Ambulatory Visit: Payer: Self-pay | Admitting: Family Medicine

## 2010-08-14 NOTE — Telephone Encounter (Signed)
Increased dose for 1 month supply. Appears she is using 2 tabs at bedtime as directions allow.  Last refill 5/13

## 2010-09-08 ENCOUNTER — Telehealth: Payer: Self-pay | Admitting: Family Medicine

## 2010-09-08 DIAGNOSIS — E785 Hyperlipidemia, unspecified: Secondary | ICD-10-CM

## 2010-09-08 DIAGNOSIS — D509 Iron deficiency anemia, unspecified: Secondary | ICD-10-CM

## 2010-09-08 NOTE — Telephone Encounter (Signed)
Message copied by Excell Seltzer on Tue Sep 08, 2010  8:10 AM ------      Message from: Baldomero Lamy      Created: Tue Sep 01, 2010  9:40 AM      Regarding: Cpx labs next wed       Please order  future cpx labs for pt's upcomming lab appt.      Thanks      Rodney Booze

## 2010-09-09 ENCOUNTER — Ambulatory Visit: Payer: Self-pay | Admitting: Family Medicine

## 2010-09-09 ENCOUNTER — Other Ambulatory Visit (INDEPENDENT_AMBULATORY_CARE_PROVIDER_SITE_OTHER): Payer: BC Managed Care – PPO

## 2010-09-09 DIAGNOSIS — E785 Hyperlipidemia, unspecified: Secondary | ICD-10-CM

## 2010-09-09 DIAGNOSIS — D509 Iron deficiency anemia, unspecified: Secondary | ICD-10-CM

## 2010-09-09 LAB — COMPREHENSIVE METABOLIC PANEL
ALT: 24 U/L (ref 0–35)
AST: 28 U/L (ref 0–37)
Albumin: 4.2 g/dL (ref 3.5–5.2)
Alkaline Phosphatase: 33 U/L — ABNORMAL LOW (ref 39–117)
BUN: 13 mg/dL (ref 6–23)
CO2: 29 mEq/L (ref 19–32)
Calcium: 9.3 mg/dL (ref 8.4–10.5)
Chloride: 109 mEq/L (ref 96–112)
Creatinine, Ser: 0.8 mg/dL (ref 0.4–1.2)
GFR: 83.94 mL/min (ref >60.00)
Glucose, Bld: 90 mg/dL (ref 70–99)
Potassium: 4.5 mEq/L (ref 3.5–5.1)
Sodium: 142 mEq/L (ref 135–145)
Total Bilirubin: 0.8 mg/dL (ref 0.3–1.2)
Total Protein: 6.4 g/dL (ref 6.0–8.3)

## 2010-09-09 LAB — CBC WITH DIFFERENTIAL/PLATELET
Basophils Absolute: 0 10*3/uL (ref 0.0–0.1)
Basophils Relative: 0.8 % (ref 0.0–3.0)
Eosinophils Absolute: 0.1 10*3/uL (ref 0.0–0.7)
Eosinophils Relative: 3.1 % (ref 0.0–5.0)
HCT: 35.3 % — ABNORMAL LOW (ref 36.0–46.0)
Hemoglobin: 12.1 g/dL (ref 12.0–15.0)
Lymphocytes Relative: 29.4 % (ref 12.0–46.0)
Lymphs Abs: 1.2 10*3/uL (ref 0.7–4.0)
MCHC: 34.2 g/dL (ref 30.0–36.0)
MCV: 92.2 fl (ref 78.0–100.0)
Monocytes Absolute: 0.3 10*3/uL (ref 0.1–1.0)
Monocytes Relative: 8.5 % (ref 3.0–12.0)
Neutro Abs: 2.3 10*3/uL (ref 1.4–7.7)
Neutrophils Relative %: 58.2 % (ref 43.0–77.0)
Platelets: 244 10*3/uL (ref 150.0–400.0)
RBC: 3.82 Mil/uL — ABNORMAL LOW (ref 3.87–5.11)
RDW: 13.2 % (ref 11.5–14.6)
WBC: 4 10*3/uL — ABNORMAL LOW (ref 4.5–10.5)

## 2010-09-09 LAB — LIPID PANEL
Cholesterol: 203 mg/dL — ABNORMAL HIGH (ref 0–200)
HDL: 85.8 mg/dL (ref >39.00)
Total CHOL/HDL Ratio: 2
Triglycerides: 78 mg/dL (ref 0.0–149.0)
VLDL: 15.6 mg/dL (ref 0.0–40.0)

## 2010-09-09 LAB — LDL CHOLESTEROL, DIRECT: Direct LDL: 86.4 mg/dL

## 2010-09-13 ENCOUNTER — Encounter: Payer: Self-pay | Admitting: Family Medicine

## 2010-09-15 ENCOUNTER — Encounter: Payer: Self-pay | Admitting: Family Medicine

## 2010-09-15 ENCOUNTER — Ambulatory Visit (INDEPENDENT_AMBULATORY_CARE_PROVIDER_SITE_OTHER): Payer: BC Managed Care – PPO | Admitting: Family Medicine

## 2010-09-15 VITALS — BP 110/70 | HR 59 | Temp 97.9°F | Ht 63.75 in | Wt 127.4 lb

## 2010-09-15 DIAGNOSIS — D509 Iron deficiency anemia, unspecified: Secondary | ICD-10-CM

## 2010-09-15 DIAGNOSIS — Z0000 Encounter for general adult medical examination without abnormal findings: Secondary | ICD-10-CM

## 2010-09-15 DIAGNOSIS — E785 Hyperlipidemia, unspecified: Secondary | ICD-10-CM

## 2010-09-15 DIAGNOSIS — Z780 Asymptomatic menopausal state: Secondary | ICD-10-CM

## 2010-09-15 DIAGNOSIS — Z01419 Encounter for gynecological examination (general) (routine) without abnormal findings: Secondary | ICD-10-CM

## 2010-09-15 MED ORDER — CLOTRIMAZOLE-BETAMETHASONE 1-0.05 % EX CREA: TOPICAL_CREAM | Freq: Two times a day (BID) | CUTANEOUS | Status: DC

## 2010-09-15 MED ORDER — TRAMADOL HCL 50 MG PO TABS: ORAL_TABLET | ORAL | Status: DC

## 2010-09-15 NOTE — Assessment & Plan Note (Signed)
Well controlled. Continue current medication.  

## 2010-09-15 NOTE — Assessment & Plan Note (Signed)
Stable on HRT.

## 2010-09-15 NOTE — Patient Instructions (Signed)
Continue working on healthy eating, and regular exercise.

## 2010-09-15 NOTE — Assessment & Plan Note (Signed)
stable °

## 2010-09-15 NOTE — Progress Notes (Signed)
Subjective:    Patient ID: Natasha Dixon, female    DOB: 12/15/1955, 55 y.o.   MRN: [id number]  HPI The patient is here for annual wellness exam and preventative care.    Last pelvic 2011  DVE no pap..s/p hys no cervix but ovaries remain  Elevated Cholesterol:On simvastatin 40 mg daily Using medications without problems:None. Muscle aches: None Other complaints:Taking regularly  TMJ, has had jaw reconstruction since 05/2010. Still having to use 2 tabs po at night, helps her sleep with the jaw pain. HAs follow up scheuled.  HRT: Symptoms well controlled on estrace.  Review of Systems  Constitutional: Negative for fever, fatigue and unexpected weight change.  HENT: Negative for ear pain.   Eyes: Negative for pain.  Respiratory: Negative for chest tightness and shortness of breath.   Cardiovascular: Negative for chest pain, palpitations and leg swelling.  Gastrointestinal: Negative for abdominal pain.  Genitourinary: Negative for dysuria.  Musculoskeletal: Negative for back pain.  Psychiatric/Behavioral: Negative for behavioral problems and dysphoric mood.       Objective:   Physical Exam  Constitutional: Vital signs are normal. She appears well-developed and well-nourished. She is cooperative.  Non-toxic appearance. She does not appear ill. No distress.  HENT:  Head: Normocephalic.  Right Ear: Hearing, tympanic membrane, external ear and ear canal normal.  Left Ear: Hearing, tympanic membrane, external ear and ear canal normal.  Nose: Nose normal.  Eyes: Conjunctivae, EOM and lids are normal. Pupils are equal, round, and reactive to light. No foreign bodies found.  Neck: Trachea normal and normal range of motion. Neck supple. Carotid bruit is not present. No mass and no thyromegaly present.  Cardiovascular: Normal rate, regular rhythm, S1 normal, S2 normal, normal heart sounds and intact distal pulses.  Exam reveals no gallop.   No murmur heard. Pulmonary/Chest: Effort normal  and breath sounds normal. No respiratory distress. She has no wheezes. She has no rhonchi. She has no rales.  Abdominal: Soft. Normal appearance and bowel sounds are normal. She exhibits no distension, no fluid wave, no abdominal bruit and no mass. There is no hepatosplenomegaly. There is no tenderness. There is no rebound, no guarding and no CVA tenderness. No hernia.  Genitourinary: Vagina normal. No breast swelling, tenderness, discharge or bleeding. Pelvic exam was performed with patient prone. There is no rash, tenderness or lesion on the right labia. There is no rash, tenderness or lesion on the left labia. Right adnexum displays no mass, no tenderness and no fullness. Left adnexum displays no mass, no tenderness and no fullness.       No uterus, no cervix  Lymphadenopathy:    She has no cervical adenopathy.    She has no axillary adenopathy.  Neurological: She is alert. She has normal strength. No cranial nerve deficit or sensory deficit.  Skin: Skin is warm, dry and intact. No rash noted.  Psychiatric: Her speech is normal and behavior is normal. Judgment normal. Her mood appears not anxious. Cognition and memory are normal. She does not exhibit a depressed mood.          Assessment & Plan:  Complete Physical Exam: The patient's preventative maintenance and recommended screening tests for an annual wellness exam were reviewed in full today. Brought up to date unless services declined.  Counselled on the importance of diet, exercise, and its role in overall health and mortality. The patient's FH and SH was reviewed, including their home life, tobacco status, and drug and alcohol status.   MAmmogram: Done  last week, results not back yet. DVE yearly no pap.  Had nml DEXA about 5 years ago normal.. No risk factors for osteoporosis..Also, on HRT, getting weight bearing exercise and taking calcium/vit D tabs daily.  Plan next check age 21.

## 2010-09-18 ENCOUNTER — Encounter: Payer: Self-pay | Admitting: Family Medicine

## 2010-10-19 ENCOUNTER — Other Ambulatory Visit: Payer: Self-pay | Admitting: Family Medicine

## 2010-11-18 ENCOUNTER — Other Ambulatory Visit: Payer: Self-pay | Admitting: Family Medicine

## 2010-11-19 NOTE — Telephone Encounter (Signed)
Last refilled 8/3  #60, 0RF.

## 2010-12-17 ENCOUNTER — Other Ambulatory Visit: Payer: Self-pay | Admitting: Family Medicine

## 2011-01-31 ENCOUNTER — Other Ambulatory Visit: Payer: Self-pay | Admitting: Family Medicine

## 2011-02-11 ENCOUNTER — Other Ambulatory Visit: Payer: Self-pay | Admitting: Family Medicine

## 2011-04-06 ENCOUNTER — Other Ambulatory Visit: Payer: Self-pay | Admitting: Family Medicine

## 2011-05-27 ENCOUNTER — Other Ambulatory Visit: Payer: Self-pay | Admitting: Family Medicine

## 2011-05-28 NOTE — Telephone Encounter (Signed)
Last ov 09-25-2010 last refill 04-06-2011

## 2011-07-22 ENCOUNTER — Other Ambulatory Visit: Payer: Self-pay | Admitting: Family Medicine

## 2011-07-22 NOTE — Telephone Encounter (Signed)
Last filled 05-27-2011

## 2011-09-06 ENCOUNTER — Other Ambulatory Visit: Payer: Self-pay | Admitting: Family Medicine

## 2011-10-01 ENCOUNTER — Telehealth: Payer: Self-pay | Admitting: Family Medicine

## 2011-10-01 DIAGNOSIS — D509 Iron deficiency anemia, unspecified: Secondary | ICD-10-CM

## 2011-10-01 DIAGNOSIS — E785 Hyperlipidemia, unspecified: Secondary | ICD-10-CM

## 2011-10-01 NOTE — Telephone Encounter (Signed)
Message copied by Excell Seltzer on Fri Oct 01, 2011 10:20 AM ------      Message from: Alvina Chou      Created: Fri Oct 01, 2011  8:13 AM      Regarding: Labs for Tuesday, 7.23.13       Patient is scheduled for CPX labs, please order future labs, Thanks , Camelia Eng

## 2011-10-04 ENCOUNTER — Telehealth: Payer: Self-pay | Admitting: Family Medicine

## 2011-10-04 DIAGNOSIS — R5383 Other fatigue: Secondary | ICD-10-CM

## 2011-10-04 NOTE — Telephone Encounter (Signed)
Patient would like for you to add an order for an in depth thyroid function test to her 10/06/11 labs.

## 2011-10-06 ENCOUNTER — Other Ambulatory Visit (INDEPENDENT_AMBULATORY_CARE_PROVIDER_SITE_OTHER): Payer: BC Managed Care – PPO

## 2011-10-06 DIAGNOSIS — R5383 Other fatigue: Secondary | ICD-10-CM

## 2011-10-06 DIAGNOSIS — D509 Iron deficiency anemia, unspecified: Secondary | ICD-10-CM

## 2011-10-06 DIAGNOSIS — R5381 Other malaise: Secondary | ICD-10-CM

## 2011-10-06 DIAGNOSIS — E785 Hyperlipidemia, unspecified: Secondary | ICD-10-CM

## 2011-10-06 LAB — COMPREHENSIVE METABOLIC PANEL
ALT: 17 U/L (ref 0–35)
AST: 25 U/L (ref 0–37)
Albumin: 4.1 g/dL (ref 3.5–5.2)
Alkaline Phosphatase: 32 U/L — ABNORMAL LOW (ref 39–117)
BUN: 18 mg/dL (ref 6–23)
CO2: 28 mEq/L (ref 19–32)
Calcium: 9.2 mg/dL (ref 8.4–10.5)
Chloride: 105 mEq/L (ref 96–112)
Creatinine, Ser: 0.8 mg/dL (ref 0.4–1.2)
GFR: 74.49 mL/min (ref >60.00)
Glucose, Bld: 86 mg/dL (ref 70–99)
Potassium: 4 mEq/L (ref 3.5–5.1)
Sodium: 140 mEq/L (ref 135–145)
Total Bilirubin: 0.8 mg/dL (ref 0.3–1.2)
Total Protein: 6.5 g/dL (ref 6.0–8.3)

## 2011-10-06 LAB — CBC WITH DIFFERENTIAL/PLATELET
Basophils Absolute: 0 10*3/uL (ref 0.0–0.1)
Basophils Relative: 0.9 % (ref 0.0–3.0)
Eosinophils Absolute: 0.1 10*3/uL (ref 0.0–0.7)
Eosinophils Relative: 3.3 % (ref 0.0–5.0)
HCT: 36.5 % (ref 36.0–46.0)
Hemoglobin: 12.3 g/dL (ref 12.0–15.0)
Lymphocytes Relative: 32.1 % (ref 12.0–46.0)
Lymphs Abs: 1.1 10*3/uL (ref 0.7–4.0)
MCHC: 33.6 g/dL (ref 30.0–36.0)
MCV: 92.4 fl (ref 78.0–100.0)
Monocytes Absolute: 0.3 10*3/uL (ref 0.1–1.0)
Monocytes Relative: 8.6 % (ref 3.0–12.0)
Neutro Abs: 1.9 10*3/uL (ref 1.4–7.7)
Neutrophils Relative %: 55.1 % (ref 43.0–77.0)
Platelets: 191 10*3/uL (ref 150.0–400.0)
RBC: 3.95 Mil/uL (ref 3.87–5.11)
RDW: 12.8 % (ref 11.5–14.6)
WBC: 3.5 10*3/uL — ABNORMAL LOW (ref 4.5–10.5)

## 2011-10-06 LAB — LIPID PANEL
Cholesterol: 192 mg/dL (ref 0–200)
HDL: 84.7 mg/dL (ref >39.00)
LDL Cholesterol: 86 mg/dL (ref 0–99)
Total CHOL/HDL Ratio: 2
Triglycerides: 107 mg/dL (ref 0.0–149.0)
VLDL: 21.4 mg/dL (ref 0.0–40.0)

## 2011-10-06 LAB — T3, FREE: T3, Free: 2.6 pg/mL (ref 2.3–4.2)

## 2011-10-06 LAB — TSH: TSH: 4.48 u[IU]/mL (ref 0.35–5.50)

## 2011-10-06 LAB — T4, FREE: Free T4: 0.69 ng/dL (ref 0.60–1.60)

## 2011-10-15 ENCOUNTER — Ambulatory Visit (INDEPENDENT_AMBULATORY_CARE_PROVIDER_SITE_OTHER): Payer: BC Managed Care – PPO | Admitting: Family Medicine

## 2011-10-15 ENCOUNTER — Encounter: Payer: Self-pay | Admitting: Family Medicine

## 2011-10-15 VITALS — BP 100/64 | HR 58 | Temp 98.1°F | Resp 20 | Ht 63.75 in | Wt 125.5 lb

## 2011-10-15 DIAGNOSIS — Z1231 Encounter for screening mammogram for malignant neoplasm of breast: Secondary | ICD-10-CM

## 2011-10-15 DIAGNOSIS — R5381 Other malaise: Secondary | ICD-10-CM

## 2011-10-15 DIAGNOSIS — R5383 Other fatigue: Secondary | ICD-10-CM | POA: Insufficient documentation

## 2011-10-15 DIAGNOSIS — Z0000 Encounter for general adult medical examination without abnormal findings: Secondary | ICD-10-CM

## 2011-10-15 DIAGNOSIS — D509 Iron deficiency anemia, unspecified: Secondary | ICD-10-CM

## 2011-10-15 DIAGNOSIS — E785 Hyperlipidemia, unspecified: Secondary | ICD-10-CM

## 2011-10-15 DIAGNOSIS — Z01419 Encounter for gynecological examination (general) (routine) without abnormal findings: Secondary | ICD-10-CM

## 2011-10-15 DIAGNOSIS — D72819 Decreased white blood cell count, unspecified: Secondary | ICD-10-CM

## 2011-10-15 NOTE — Assessment & Plan Note (Signed)
eval B12 and vir D

## 2011-10-15 NOTE — Assessment & Plan Note (Signed)
Resolved

## 2011-10-15 NOTE — Addendum Note (Signed)
Addended by: Alvina Chou on: 10/15/2011 02:57 PM   Modules accepted: Orders

## 2011-10-15 NOTE — Patient Instructions (Addendum)
Strop at front desk for referral to [facility] and for mammogram.  Try to limit tramadol as much as possible.  Call [facility] about colonoscopy.

## 2011-10-15 NOTE — Assessment & Plan Note (Signed)
Referral to derm given chronic fatigue and further drop in wbc to 3.5 from chronically low number of 4.0

## 2011-10-15 NOTE — Progress Notes (Signed)
The patient is here for annual wellness exam and preventative care.   Last pelvic 2012 DVE no pap..s/p hys no cervix but ovaries remain  She had wanted thyroid checked because she has had fatigue for years. Feels cold frequently compared to other people, ongoing for many years. No constipation, does not have last edge of eyebrow.  No peripheral swelling. Sister with low thyroid. Had some weight gain in early year.  Elevated Cholesterol:At goal on simvastatin 40 mg daily  Lab Results  Component Value Date   CHOL 192 10/06/2011   HDL 84.70 10/06/2011   LDLCALC 86 10/06/2011   LDLDIRECT 86.4 09/09/2010   TRIG 107.0 10/06/2011   CHOLHDL 2 10/06/2011  Using medications without problems:None.  Muscle aches: None  Other complaints:Taking regularly  Regular exercise nd healthy eating  TMJ, has had jaw reconstruction since 05/2010. Now using  tramadol  1 tab during the day as well as 2 tabs po at night, helps her sleep with the jaw pain.  She notes jaw clamping. Ibuprofen irritating.   HRT: Symptoms well controlled on estrace.   Review of Systems  Constitutional: Negative for fever, fatigue and unexpected weight change.  HENT: Negative for ear pain.  Eyes: Negative for pain.  Respiratory: Negative for chest tightness and shortness of breath.  Cardiovascular: Negative for chest pain, palpitations and leg swelling.  Gastrointestinal: Negative for abdominal pain.  Genitourinary: Negative for dysuria.  Musculoskeletal: Negative for back pain.  Psychiatric/Behavioral: Negative for behavioral problems and dysphoric mood.    Objective:   Physical Exam  Constitutional: Vital signs are normal. She appears well-developed and well-nourished. She is cooperative. Non-toxic appearance. She does not appear ill. No distress.  HENT:  Head: Normocephalic.  Right Ear: Hearing, tympanic membrane, external ear and ear canal normal.  Left Ear: Hearing, tympanic membrane, external ear and ear canal normal.    Nose: Nose normal.  Eyes: Conjunctivae, EOM and lids are normal. Pupils are equal, round, and reactive to light. No foreign bodies found.  Neck: Trachea normal and normal range of motion. Neck supple. Carotid bruit is not present. No mass and no thyromegaly present.  Cardiovascular: Normal rate, regular rhythm, S1 normal, S2 normal, normal heart sounds and intact distal pulses. Exam reveals no gallop.  No murmur heard.  Pulmonary/Chest: Effort normal and breath sounds normal. No respiratory distress. She has no wheezes. She has no rhonchi. She has no rales.  Abdominal: Soft. Normal appearance and bowel sounds are normal. She exhibits no distension, no fluid wave, no abdominal bruit and no mass. There is no hepatosplenomegaly. There is no tenderness. There is no rebound, no guarding and no CVA tenderness. No hernia.  Genitourinary: Vagina normal. No breast swelling, tenderness, discharge or bleeding. Pelvic exam was performed with patient prone. There is no rash, tenderness or lesion on the right labia. There is no rash, tenderness or lesion on the left labia. Right adnexum displays no mass, no tenderness and no fullness. Left adnexum displays no mass, no tenderness and no fullness.  No uterus, no cervix  Lymphadenopathy:  She has no cervical adenopathy.  She has no axillary adenopathy.  Neurological: She is alert. She has normal strength. No cranial nerve deficit or sensory deficit.  Skin: Skin is warm, dry and intact. No rash noted.  Psychiatric: Her speech is normal and behavior is normal. Judgment normal. Her mood appears not anxious. Cognition and memory are normal. She does not exhibit a depressed mood.    Assessment & Plan:  Complete Physical Exam: The patient's preventative maintenance and recommended screening tests for an annual wellness exam were reviewed in full today.  Brought up to date unless services declined.  Counselled on the importance of diet, exercise, and its role in  overall health and mortality.  The patient's FH and SH was reviewed, including their home life, tobacco status, and drug and alcohol status.   Mammogram: Due for this DVE yearly no pap.  Had nml DEXA about 5 years ago normal.. No risk factors for osteoporosis..Also, on HRT, getting weight bearing exercise and taking calcium/vit D tabs daily. Plan next check age 49.  Colon: 2006 nml, family history of polyps... Recommended every 5 years. Vaccine: Td 2009

## 2011-10-15 NOTE — Assessment & Plan Note (Signed)
Well controlled on simvastatin. Encouraged exercise, weight loss, healthy eating habits.

## 2011-10-16 LAB — VITAMIN D 25 HYDROXY (VIT D DEFICIENCY, FRACTURES): Vit D, 25-Hydroxy: 60 ng/mL (ref 30–89)

## 2011-10-16 LAB — VITAMIN B12: Vitamin B-12: 303 pg/mL (ref 211–911)

## 2011-10-18 ENCOUNTER — Encounter: Payer: Self-pay | Admitting: *Deleted

## 2011-10-21 ENCOUNTER — Telehealth: Payer: Self-pay | Admitting: Oncology

## 2011-10-21 NOTE — Telephone Encounter (Signed)
S/w pt today re appt w/[facility]. Due to pt's mom had a stroke and she is on her way out of town w/no return date yet appt was scheduled for 9/6 per pt request. Because pt was driving I called again and left detailed information re appt on vm. Pt will check vm and call me back if she needs to make any changes.

## 2011-10-22 ENCOUNTER — Other Ambulatory Visit: Payer: Self-pay | Admitting: Family Medicine

## 2011-10-25 ENCOUNTER — Telehealth: Payer: Self-pay | Admitting: Oncology

## 2011-10-25 NOTE — Telephone Encounter (Signed)
Pt called to r/s 9/6 np appt to 9/4.

## 2011-10-27 ENCOUNTER — Telehealth: Payer: Self-pay | Admitting: Oncology

## 2011-10-27 NOTE — Telephone Encounter (Signed)
Referred by Dr. Ermalene Searing Dx- Leukopenia. NP packet mailed out.

## 2011-10-27 NOTE — Telephone Encounter (Signed)
Chart Delivered. °

## 2011-11-08 ENCOUNTER — Telehealth: Payer: Self-pay | Admitting: *Deleted

## 2011-11-08 NOTE — Telephone Encounter (Signed)
Patient called stating that the last time she was in the office the directions on her Tramadol was changed to three times a day. Patient states the last time it was filled she only got #60 and it should have #90 for a month's supply. Patient states that the pharmacy told her to notify her doctor of this. Patient needs pharmacy notified of the change. [facility]

## 2011-11-09 MED ORDER — TRAMADOL HCL 50 MG PO TABS: ORAL_TABLET | ORAL | Status: DC

## 2011-11-09 NOTE — Telephone Encounter (Signed)
MAke change on med list to 3 timnes daily. Pt is top limt as able though. Go ahead and refill #90, 0RF.

## 2011-11-09 NOTE — Addendum Note (Signed)
Addended by: Eliezer Bottom on: 11/09/2011 12:47 PM   Modules accepted: Orders

## 2011-11-09 NOTE — Telephone Encounter (Signed)
See below

## 2011-11-09 NOTE — Telephone Encounter (Signed)
Changed on medicine list, sent to [facility].

## 2011-11-16 ENCOUNTER — Encounter: Payer: Self-pay | Admitting: Oncology

## 2011-11-16 DIAGNOSIS — D509 Iron deficiency anemia, unspecified: Secondary | ICD-10-CM | POA: Insufficient documentation

## 2011-11-16 DIAGNOSIS — D72819 Decreased white blood cell count, unspecified: Secondary | ICD-10-CM | POA: Insufficient documentation

## 2011-11-16 NOTE — Patient Instructions (Addendum)
1.  Diagnosis:  Leukopenia (low white blood cell count):  Chronic for at least 3 years.  There is no neutropenia, leukopenia, anemia or low platelet.   Today, your WBC was normal.  2.  Causes:  This is most likely benign.  My reviewing of blood smear today did not show concerning finding.   3.  Work up:  I sent for VitB12, HIV, ANA (to rule out autoimmune causes).  A diagnostic bone marrow biopsy at this time would be premature.  4.  Follow up:  Discharge to primary care physician.  Please have CBC check at least once a year.  In the future, if your WBC and Neutrophil are low; or you also develop anemia and low platelet count, then further evaluation may be considered.

## 2011-11-17 ENCOUNTER — Ambulatory Visit (HOSPITAL_BASED_OUTPATIENT_CLINIC_OR_DEPARTMENT_OTHER): Payer: BC Managed Care – PPO | Admitting: Oncology

## 2011-11-17 ENCOUNTER — Other Ambulatory Visit (HOSPITAL_BASED_OUTPATIENT_CLINIC_OR_DEPARTMENT_OTHER): Payer: BC Managed Care – PPO | Admitting: Lab

## 2011-11-17 ENCOUNTER — Ambulatory Visit: Payer: BC Managed Care – PPO

## 2011-11-17 ENCOUNTER — Encounter: Payer: Self-pay | Admitting: Oncology

## 2011-11-17 VITALS — BP 110/77 | HR 58 | Temp 96.8°F | Resp 20 | Ht 63.75 in | Wt 127.4 lb

## 2011-11-17 DIAGNOSIS — D72819 Decreased white blood cell count, unspecified: Secondary | ICD-10-CM

## 2011-11-17 DIAGNOSIS — D509 Iron deficiency anemia, unspecified: Secondary | ICD-10-CM

## 2011-11-17 DIAGNOSIS — R5381 Other malaise: Secondary | ICD-10-CM

## 2011-11-17 LAB — CBC WITH DIFFERENTIAL/PLATELET
BASO%: 0.4 % (ref 0.0–2.0)
Basophils Absolute: 0 10*3/uL (ref 0.0–0.1)
EOS%: 2.1 % (ref 0.0–7.0)
Eosinophils Absolute: 0.1 10*3/uL (ref 0.0–0.5)
HCT: 37 % (ref 34.8–46.6)
HGB: 12.7 g/dL (ref 11.6–15.9)
LYMPH%: 18.4 % (ref 14.0–49.7)
MCH: 30.5 pg (ref 25.1–34.0)
MCHC: 34.3 g/dL (ref 31.5–36.0)
MCV: 88.9 fL (ref 79.5–101.0)
MONO#: 0.3 10*3/uL (ref 0.1–0.9)
MONO%: 6.8 % (ref 0.0–14.0)
NEUT#: 3.5 10*3/uL (ref 1.5–6.5)
NEUT%: 72.3 % (ref 38.4–76.8)
Platelets: 198 10*3/uL (ref 145–400)
RBC: 4.16 10*6/uL (ref 3.70–5.45)
RDW: 12.3 % (ref 11.2–14.5)
WBC: 4.8 10*3/uL (ref 3.9–10.3)
lymph#: 0.9 10*3/uL (ref 0.9–3.3)

## 2011-11-17 LAB — MORPHOLOGY
PLT EST: ADEQUATE
RBC Comments: NORMAL

## 2011-11-17 LAB — CHCC SMEAR

## 2011-11-17 NOTE — Progress Notes (Signed)
[facility]  Telephone:(336) [phone] [phone]     INITIAL HEMATOLOGY CONSULTATION    Referral MD:  Dr. Kerby Nora, M.D.   Reason for Referral: leukopenia.    HPI:  Mrs. Natasha Dixon is a 56 year-old woman with no significant past medical history.  Within the past few months, she has been developing slowly mild fatigue; cold sensitivity, hair thinning especially eyebrows; dry skin. She saw Dr. Ermalene Searing and requested a screening lab panel. Her TSH was normal. However, she had mild leukopenia which was slightly worsened compared to before. Of note the oldest available CBC in Epic dated back to 05/31/2008 which showed white blood cell 4.0, hemoglobin 12.2, platelets 172. On 10/06/2011, her WBC was 3.5,  Hemoglobin 12.3, platelets 191. Given the slight leukopenia, she was kindly referred to the cancer Center for evaluation.    Natasha Dixon presented to the clinic herself today for evaluation for the first time. She reported mild fatigue. She is however still independent of all activities of daily living and perform chores around the house. She denies any recurrent infections such as aphthous ulcer, productive cough, hematuria, pyuria, dysuria, diarrhea, skin rash, cellulitis, skin abscess.  She does however have some mild pain in the proximal joints of her bilateral hands.  She reported that her screening mammogram and colonoscopy are up-to-date.  Patient denies fever, anorexia, weight loss, headache, visual changes, confusion, drenching night sweats, palpable lymph node swelling, mucositis, odynophagia, dysphagia, nausea vomiting, jaundice, chest pain, palpitation, shortness of breath, dyspnea on exertion, productive cough, gum bleeding, epistaxis, hematemesis, hemoptysis, abdominal pain, abdominal swelling, early satiety, melena, hematochezia, hematuria, skin rash, spontaneous bleeding, bowel bladder incontinence, back pain, focal motor weakness, paresthesia, depression,  suicidal or homicidal ideation, feeling hopelessness.     Past Medical History  Diagnosis Date  . Iron deficiency anemia   . Depression   . Allergy     allergic rhinitis  . Hyperlipidemia   . Leukopenia   :    Past Surgical History  Procedure Date  . Vaginal hysterectomy 1994    no cervix, still has ovaries  . Pilonidal cyst excision 1973  . Cesarean section 1990, 1993  . Cervical biopsy 1986    cone biopsy  . Exploratory laparotomy 1994    for mennorhagia, no endometriosis  . Elbow surgery 2003    chronic tendonitis  . Tonsillectomy   . Breast biopsy 2008    @ [facility], fibrocystic disease  . Colonoscopy 2008    [facility]; next due 2018.   :   CURRENT MEDS: Current Outpatient Prescriptions  Medication Sig Dispense Refill  . calcium carbonate 200 MG capsule Take 250 mg by mouth 2 (two) times daily with a meal.      . clotrimazole-betamethasone (LOTRISONE) cream APPLY TO AFFECTED AREA AS DIRECTED  15 g  0  . estradiol (ESTRACE) 0.5 MG tablet 1 TAB BY MOUTH DAILY ALONG WITH 1 MG TAB  90 tablet  3  . estradiol (ESTRACE) 1 MG tablet TAKE 1 TABLET EVERY DAY  90 tablet  3  . fish oil-omega-3 fatty acids 1000 MG capsule Take 1 g by mouth daily.      . mometasone (ELOCON) 0.1 % lotion Use as directed      . Multiple Vitamin (MULTIVITAMIN) tablet Take 1 tablet by mouth daily.      . simvastatin (ZOCOR) 40 MG tablet TAKE 1 TABLET BY MOUTH ONCE A DAY  90 tablet  3  . traMADol (ULTRAM) 50  MG tablet Take one by mouth three times a day.  90 tablet  0  . valACYclovir (VALTREX) 1000 MG tablet Take 1,000 mg by mouth 2 (two) times daily. For 1 day as needed for herpes outbreak           No Known Allergies:  Family History  Problem Relation Age of Onset  . Hyperlipidemia Mother   . Arthritis Father   . Coronary artery disease Father   . Heart attack Father 75  . Hypertension Sister   . Hyperlipidemia Sister   . Hypothyroidism Sister   . Hypothyroidism Other     aunt  . Cancer  Paternal Aunt 48    ovarian cancer   . Cancer Paternal Uncle     esophageal cancer  :  History   Social History  . Marital Status: Married    Spouse Name: N/A    Number of Children: 2  . Years of Education: N/A   Occupational History  . housewife    Social History Main Topics  . Smoking status: Former Smoker -- 20 years    Quit date: 11/20/2009  . Smokeless tobacco: Not on file  . Alcohol Use: Yes     wine, cocktails on weekends  . Drug Use: Yes     remote marijuana  . Sexually Active: Not on file   Other Topics Concern  . Not on file   Social History Narrative   Regular exercise 3 times a weekDiet: increasing fish, lean meats, low carb  :  REVIEW OF SYSTEM:  The rest of the 14-point review of sytem was negative.   Exam:  ECOG 0  General: Thin-appearing woman; in no acute distress.  Eyes:  no scleral icterus.  ENT:  There were no oropharyngeal lesions.  Neck was without thyromegaly.  Lymphatics:  Negative cervical, supraclavicular or axillary adenopathy.  Respiratory: lungs were clear bilaterally without wheezing or crackles.  Cardiovascular:  Regular rate and rhythm, S1/S2, without murmur, rub or gallop.  There was no pedal edema.  GI:  abdomen was soft, flat, nontender, nondistended, without organomegaly.  Muscoloskeletal:  no spinal tenderness of palpation of vertebral spine.  I did not appreciate any major swelling or pain to palpation of the joints of her bilateral hands.  Skin exam was without echymosis, petichae.  Neuro exam was nonfocal.  Patient was able to get on and off exam table without assistance.  Gait was normal.  Patient was alerted and oriented.  Attention was good.   Language was appropriate.  Mood was normal without depression.  Speech was not pressured.  Thought content was not tangential.    LABS:  Lab Results  Component Value Date   WBC 4.8 11/17/2011   HGB 12.7 11/17/2011   HCT 37.0 11/17/2011   PLT 198 11/17/2011   GLUCOSE 86 10/06/2011   CHOL 192  10/06/2011   TRIG 107.0 10/06/2011   HDL 84.70 10/06/2011   LDLDIRECT 86.4 09/09/2010   LDLCALC 86 10/06/2011   ALT 17 10/06/2011   AST 25 10/06/2011   NA 140 10/06/2011   K 4.0 10/06/2011   CL 105 10/06/2011   CREATININE 0.8 10/06/2011   BUN 18 10/06/2011   CO2 28 10/06/2011    Blood smear review:   I personally reviewed the patient's peripheral blood smear today.  There was isocytosis.  There was no peripheral blast.  There was no schistocytosis, spherocytosis, target cell, rouleaux formation, tear drop cell.  There was no giant platelets or platelet  clumps.      ASSESSMENT AND PLAN:   1.  Diagnosis:  Leukopenia:  Chronic for at least 3 years.  There is no neutropenia, leukopenia, anemia or low platelet.   Today, your WBC was normal.  2.  Causes:  This is most likely benign.  My reviewing of blood smear today did not show concerning finding.   3.  Work up:  I sent for VitB12, HIV, ANA (to rule out autoimmune causes).  A diagnostic bone marrow biopsy at this time would be premature.  4.  Follow up:  Discharge to primary care physician.  Please have CBC check at least once a year.  In the future, if she has neutropenia in addition to leukopenia; or she develops anemia and thrombocytopenia in addition to leukopenia, then further evaluation may be considered.    Thank you for this referral.  The length of time of the face-to-face encounter was 30 minutes. More than 50% of time was spent counseling and coordination of care.

## 2011-11-19 ENCOUNTER — Ambulatory Visit: Payer: BC Managed Care – PPO

## 2011-11-19 ENCOUNTER — Ambulatory Visit: Payer: BC Managed Care – PPO | Admitting: Oncology

## 2011-11-19 ENCOUNTER — Other Ambulatory Visit: Payer: BC Managed Care – PPO | Admitting: Lab

## 2011-11-19 LAB — ANA: Anti Nuclear Antibody(ANA): NEGATIVE

## 2011-11-19 LAB — METHYLMALONIC ACID, SERUM: Methylmalonic Acid, Quant: 0.23 umol/L (ref <0.40)

## 2011-11-19 LAB — HIV ANTIBODY (ROUTINE TESTING W REFLEX): HIV: NONREACTIVE

## 2011-11-30 ENCOUNTER — Ambulatory Visit: Payer: Self-pay | Admitting: Family Medicine

## 2011-12-01 ENCOUNTER — Encounter: Payer: Self-pay | Admitting: Family Medicine

## 2011-12-08 ENCOUNTER — Other Ambulatory Visit: Payer: Self-pay | Admitting: Family Medicine

## 2011-12-08 NOTE — Telephone Encounter (Signed)
Refill request for Tramadol 50 mg. Request too soon.

## 2011-12-09 ENCOUNTER — Other Ambulatory Visit: Payer: Self-pay | Admitting: Family Medicine

## 2011-12-09 NOTE — Telephone Encounter (Signed)
Refill request for Tramadol 50 mg ok to refill?

## 2011-12-09 NOTE — Telephone Encounter (Signed)
Patient advised.

## 2011-12-09 NOTE — Telephone Encounter (Signed)
Sent!

## 2011-12-09 NOTE — Telephone Encounter (Signed)
Pt called and is leaving for [place] early AM tomorrow and needs to pick up med today at [facility]. Please call pt when med refilled [phone].

## 2012-01-07 ENCOUNTER — Other Ambulatory Visit: Payer: Self-pay | Admitting: Family Medicine

## 2012-01-07 NOTE — Telephone Encounter (Signed)
Electronic refill request

## 2012-01-20 ENCOUNTER — Other Ambulatory Visit: Payer: Self-pay | Admitting: Family Medicine

## 2012-01-20 MED ORDER — ESTRADIOL 0.5 MG PO TABS: ORAL_TABLET | ORAL | Status: AC

## 2012-02-03 ENCOUNTER — Other Ambulatory Visit: Payer: Self-pay | Admitting: Family Medicine

## 2012-02-25 ENCOUNTER — Other Ambulatory Visit: Payer: Self-pay | Admitting: Family Medicine

## 2012-03-01 ENCOUNTER — Other Ambulatory Visit: Payer: Self-pay | Admitting: Family Medicine

## 2012-04-01 ENCOUNTER — Other Ambulatory Visit: Payer: Self-pay | Admitting: Family Medicine

## 2012-04-03 NOTE — Telephone Encounter (Signed)
Pt came by office going out of town now; pt said [facility] was to request Tramadol refill 03/28/12 and did not. Pt said has TMJ and takes med daily. Dr Patsy Lager said OK to fill Tramadol 50 mg # 90 x 0 refill. Med called to [facility]. Pt notified while here in office.

## 2012-04-03 NOTE — Telephone Encounter (Signed)
Refilled   Natasha Beat, MD 04/03/2012, 2:42 PM

## 2012-04-28 ENCOUNTER — Other Ambulatory Visit: Payer: Self-pay | Admitting: Family Medicine

## 2012-05-26 ENCOUNTER — Other Ambulatory Visit: Payer: Self-pay | Admitting: *Deleted

## 2012-05-26 MED ORDER — TRAMADOL HCL 50 MG PO TABS: ORAL_TABLET | ORAL | Status: DC

## 2012-05-26 NOTE — Telephone Encounter (Signed)
Last filled 04/30/12

## 2012-06-25 ENCOUNTER — Other Ambulatory Visit: Payer: Self-pay | Admitting: Family Medicine

## 2012-06-27 ENCOUNTER — Telehealth: Payer: Self-pay

## 2012-06-27 NOTE — Telephone Encounter (Signed)
Pt left v/m cking on status of Tramadol refill; spoke with pharmacist at [facility]; pt has already picked up rx.unable to reach pt by phone.

## 2012-07-18 ENCOUNTER — Other Ambulatory Visit: Payer: Self-pay

## 2012-07-18 MED ORDER — TRAMADOL HCL 50 MG PO TABS: ORAL_TABLET | ORAL | Status: DC

## 2012-07-18 NOTE — Telephone Encounter (Signed)
Pt left v/m requesting early refill Tramadol (not due to pick up until 07/27/12). Pt going out of town for 2 1/2 weeks on 07/22/12. Pt wants to pick up Tramadol by 07/21/12. Pt request call back when called in.Please advise.

## 2012-07-18 NOTE — Telephone Encounter (Signed)
Okay to refill early in this case with this med.

## 2012-08-15 ENCOUNTER — Other Ambulatory Visit: Payer: Self-pay | Admitting: Family Medicine

## 2013-01-04 ENCOUNTER — Telehealth: Payer: Self-pay | Admitting: Family Medicine

## 2013-01-04 DIAGNOSIS — Z87898 Personal history of other specified conditions: Secondary | ICD-10-CM

## 2013-01-04 NOTE — Telephone Encounter (Signed)
Just screening Bilateral or diagnostic?

## 2013-01-04 NOTE — Telephone Encounter (Signed)
Pt called and would like orders placed for a mammogram at [facility].   She has a history of needle biopsy on left breast and [place] requires an order prior to scheduling .  Best number to call patient [phone].

## 2013-01-05 NOTE — Telephone Encounter (Signed)
Screening Bilateral is all that is required. Delford Field stated) / lt

## 2013-02-21 ENCOUNTER — Ambulatory Visit: Payer: Self-pay | Admitting: Family Medicine

## 2013-02-22 ENCOUNTER — Encounter: Payer: Self-pay | Admitting: Family Medicine

## 2014-04-15 ENCOUNTER — Ambulatory Visit: Payer: Self-pay

## 2016-04-19 DIAGNOSIS — M7540 Impingement syndrome of unspecified shoulder: Secondary | ICD-10-CM | POA: Insufficient documentation

## 2016-12-21 ENCOUNTER — Other Ambulatory Visit: Payer: Self-pay | Admitting: Family Medicine

## 2016-12-21 DIAGNOSIS — Z1239 Encounter for other screening for malignant neoplasm of breast: Secondary | ICD-10-CM

## 2017-01-11 ENCOUNTER — Ambulatory Visit
Admission: RE | Admit: 2017-01-11 | Discharge: 2017-01-11 | Disposition: A | Discharge status: Home / Self Care | Payer: BLUE CROSS/BLUE SHIELD | Source: Ambulatory Visit | Attending: Family Medicine | Admitting: Family Medicine

## 2017-01-11 DIAGNOSIS — Z1231 Encounter for screening mammogram for malignant neoplasm of breast: Secondary | ICD-10-CM | POA: Diagnosis present

## 2017-01-11 DIAGNOSIS — Z1239 Encounter for other screening for malignant neoplasm of breast: Secondary | ICD-10-CM

## 2017-02-25 ENCOUNTER — Telehealth: Payer: Self-pay

## 2017-02-25 ENCOUNTER — Other Ambulatory Visit: Payer: Self-pay

## 2017-02-25 ENCOUNTER — Encounter: Payer: Self-pay | Admitting: *Deleted

## 2017-02-25 DIAGNOSIS — Z1211 Encounter for screening for malignant neoplasm of colon: Secondary | ICD-10-CM

## 2017-02-25 NOTE — Telephone Encounter (Signed)
Gastroenterology Pre-Procedure Review  Request Date:  Requesting Physician: Dr.   PATIENT REVIEW QUESTIONS: The patient responded to the following health history questions as indicated:    1. Are you having any GI issues? no 2. Do you have a personal history of Polyps? no 3. Do you have a family history of Colon Cancer or Polyps? no 4. Diabetes Mellitus? no 5. Joint replacements in the past 12 months?no 6. Major health problems in the past 3 months?no 7. Any artificial heart valves, MVP, or defibrillator?no    MEDICATIONS & ALLERGIES:    Patient reports the following regarding taking any anticoagulation/antiplatelet therapy:   Plavix, Coumadin, Eliquis, Xarelto, Lovenox, Pradaxa, Brilinta, or Effient? no Aspirin? no  Patient confirms/reports the following medications:  Current Outpatient Medications  Medication Sig Dispense Refill  . calcium carbonate 200 MG capsule Take 250 mg by mouth 2 (two) times daily with a meal.    . clotrimazole-betamethasone (LOTRISONE) cream APPLY TO AFFECTED AREA AS DIRECTED 15 g 0  . estradiol (ESTRACE) 0.5 MG tablet 1 TAB BY MOUTH DAILY ALONG WITH 1 MG TAB 90 tablet 3  . estradiol (ESTRACE) 1 MG tablet TAKE 1 TABLET EVERY DAY 90 tablet 3  . fish oil-omega-3 fatty acids 1000 MG capsule Take 1 g by mouth daily.    . mometasone (ELOCON) 0.1 % lotion Use as directed    . Multiple Vitamin (MULTIVITAMIN) tablet Take 1 tablet by mouth daily.    . simvastatin (ZOCOR) 40 MG tablet TAKE 1 TABLET BY MOUTH ONCE A DAY 90 tablet 3  . traMADol (ULTRAM) 50 MG tablet TAKE 1 TABLET BY MOUTH 3 TIMES A DAY 90 tablet 0  . valACYclovir (VALTREX) 1000 MG tablet Take 1,000 mg by mouth 2 (two) times daily. For 1 day as needed for herpes outbreak      No current facility-administered medications for this visit.     Patient confirms/reports the following allergies:  No Known Allergies  No orders of the defined types were placed in this encounter.   AUTHORIZATION  INFORMATION Primary Insurance: 1D#: Group #:  Secondary Insurance: 1D#: Group #:  SCHEDULE INFORMATION: Date: 04/08/17 Time: Location: [facility]

## 2017-04-05 ENCOUNTER — Other Ambulatory Visit: Payer: Self-pay

## 2017-04-05 MED ORDER — PEG 3350-KCL-NA BICARB-NACL 420 G PO SOLR: 4000.0000 mL | Freq: Once | ORAL | 0 refills | Status: AC

## 2017-04-05 NOTE — Progress Notes (Signed)
LVM for patient callback.  Suprep was rejected by insurance.   Called in [place] to pharmacy.

## 2017-04-07 ENCOUNTER — Encounter: Payer: Self-pay | Admitting: *Deleted

## 2017-04-07 MED ORDER — SODIUM CHLORIDE 0.9 % IV SOLN: INTRAVENOUS | Status: DC

## 2017-04-08 ENCOUNTER — Ambulatory Visit: Payer: BLUE CROSS/BLUE SHIELD | Admitting: Anesthesiology

## 2017-04-08 ENCOUNTER — Encounter: Payer: Self-pay | Admitting: Anesthesiology

## 2017-04-08 ENCOUNTER — Encounter
Admission: RE | Disposition: A | Discharge status: Home / Self Care | Payer: Self-pay | Source: Ambulatory Visit | Attending: Gastroenterology

## 2017-04-08 ENCOUNTER — Ambulatory Visit
Admission: RE | Admit: 2017-04-08 | Discharge: 2017-04-08 | Disposition: A | Discharge status: Home / Self Care | Payer: BLUE CROSS/BLUE SHIELD | Source: Ambulatory Visit | Attending: Gastroenterology | Admitting: Gastroenterology

## 2017-04-08 DIAGNOSIS — K640 First degree hemorrhoids: Secondary | ICD-10-CM | POA: Diagnosis not present

## 2017-04-08 DIAGNOSIS — K635 Polyp of colon: Secondary | ICD-10-CM | POA: Diagnosis not present

## 2017-04-08 DIAGNOSIS — E785 Hyperlipidemia, unspecified: Secondary | ICD-10-CM | POA: Insufficient documentation

## 2017-04-08 DIAGNOSIS — D125 Benign neoplasm of sigmoid colon: Secondary | ICD-10-CM

## 2017-04-08 DIAGNOSIS — Z79899 Other long term (current) drug therapy: Secondary | ICD-10-CM | POA: Insufficient documentation

## 2017-04-08 DIAGNOSIS — F329 Major depressive disorder, single episode, unspecified: Secondary | ICD-10-CM | POA: Insufficient documentation

## 2017-04-08 DIAGNOSIS — Z87891 Personal history of nicotine dependence: Secondary | ICD-10-CM | POA: Diagnosis not present

## 2017-04-08 DIAGNOSIS — Z1211 Encounter for screening for malignant neoplasm of colon: Secondary | ICD-10-CM | POA: Diagnosis not present

## 2017-04-08 HISTORY — PX: COLONOSCOPY WITH PROPOFOL: SHX5780

## 2017-04-08 SURGERY — COLONOSCOPY WITH PROPOFOL
Anesthesia: General

## 2017-04-08 MED ORDER — PROPOFOL 10 MG/ML IV BOLUS
INTRAVENOUS | Status: DC | PRN
  Administered 2017-04-08: 50 mg via INTRAVENOUS

## 2017-04-08 MED ORDER — LACTATED RINGERS IV SOLN
INTRAVENOUS | Status: DC | PRN
  Administered 2017-04-08: 08:00:00 via INTRAVENOUS

## 2017-04-08 MED ORDER — SODIUM CHLORIDE 0.9 % IV SOLN
INTRAVENOUS | Status: DC
  Administered 2017-04-08: 1000 mL via INTRAVENOUS

## 2017-04-08 MED ORDER — PROPOFOL 500 MG/50ML IV EMUL
INTRAVENOUS | Status: DC | PRN
  Administered 2017-04-08: 100 ug/kg/min via INTRAVENOUS

## 2017-04-08 NOTE — Anesthesia Post-op Follow-up Note (Signed)
Anesthesia QCDR form completed.        

## 2017-04-08 NOTE — H&P (Signed)
Jonathon Bellows, MD [address] [place], [place], [place], [address] [address], [place], [place], [place], [address] Phone: [phone]  Fax: [phone]  Primary Care Physician:  Evette Cristal A   Pre-Procedure History & Physical: HPI:  Natasha Dixon is a 62 y.o. female is here for an colonoscopy.   Past Medical History:  Diagnosis Date  . Allergy    allergic rhinitis  . Depression   . Hyperlipidemia   . Iron deficiency anemia   . Leukopenia     Past Surgical History:  Procedure Laterality Date  . BREAST BIOPSY  2008   @ [facility], fibrocystic disease  . CERVICAL BIOPSY  1986   cone biopsy  . [place]  . COLONOSCOPY  2008   [facility]; next due 2018.   [facility] Kitchen ELBOW SURGERY  2003   chronic tendonitis  . EXPLORATORY LAPAROTOMY  1994   for mennorhagia, no endometriosis  . PILONIDAL CYST EXCISION  1973  . TONSILLECTOMY    . VAGINAL HYSTERECTOMY  1994   no cervix, still has ovaries    Prior to Admission medications   Medication Sig Start Date End Date Taking? Authorizing Provider  calcium carbonate 200 MG capsule Take 250 mg by mouth 2 (two) times daily with a meal.   Yes [provider]  clotrimazole-betamethasone (LOTRISONE) cream APPLY TO AFFECTED AREA AS DIRECTED 02/25/12  Yes Bedsole, Amy E, MD  estradiol (ESTRACE) 0.5 MG tablet 1 TAB BY MOUTH DAILY ALONG WITH 1 MG TAB 01/20/12  Yes Bedsole, Amy E, MD  fish oil-omega-3 fatty acids 1000 MG capsule Take 1 g by mouth daily.   Yes [provider]  levothyroxine (SYNTHROID, LEVOTHROID) 50 MCG tablet levothyroxine 50 mcg tablet  TAKE 1 TABLET BY MOUTH ONCE DAILY 09/20/14  Yes [provider]  meloxicam (MOBIC) 15 MG tablet meloxicam 15 mg tablet  TAKE 1 TABLET BY MOUTH DAILY   Yes [provider]  methocarbamol (ROBAXIN) 500 MG tablet methocarbamol 500 mg tablet  TAKE 1 TABLET BY MOUTH 3 TIMES A DAY   Yes [provider]  mometasone (ELOCON)  0.1 % lotion Use as directed 11/09/11  Yes [provider]  Multiple Vitamin (MULTIVITAMIN) tablet Take 1 tablet by mouth daily.   Yes [provider]  omega-3 acid ethyl esters (LOVAZA) 1 g capsule Take by mouth.   Yes [provider]  phentermine (ADIPEX-P) 37.5 MG tablet phentermine 37.5 mg tablet  TAKE 1 TABLET BY MOUTH ONCE DAILY   Yes [provider]  simvastatin (ZOCOR) 40 MG tablet TAKE 1 TABLET BY MOUTH ONCE A DAY 01/20/12  Yes Bedsole, Amy E, MD  thyroid (ARMOUR THYROID) 60 MG tablet Armour Thyroid 60 mg tablet   Yes [provider]  traMADol (ULTRAM) 50 MG tablet TAKE 1 TABLET BY MOUTH 3 TIMES A DAY 08/15/12  Yes Bedsole, Amy E, MD  valACYclovir (VALTREX) 1000 MG tablet Take 1,000 mg by mouth 2 (two) times daily. For 1 day as needed for herpes outbreak    Yes [provider]    Allergies as of 02/25/2017  . (No Known Allergies)    Family History  Problem Relation Age of Onset  . Hyperlipidemia Mother   . Arthritis Father   . Coronary artery disease Father   . Heart attack Father 32  . Cancer Paternal Aunt 72       ovarian cancer   .  Cancer Paternal Uncle        esophageal cancer  . Hypertension Sister   . Hyperlipidemia Sister   . Hypothyroidism Sister   . Hypothyroidism Other        aunt    Social History   Socioeconomic History  . Marital status: Married    Spouse name: Not on file  . Number of children: 2  . Years of education: Not on file  . Highest education level: Not on file  Social Needs  . Financial resource strain: Not on file  . Food insecurity - worry: Not on file  . Food insecurity - inability: Not on file  . Transportation needs - medical: Not on file  . Transportation needs - non-medical: Not on file  Occupational History  . Occupation: housewife  Tobacco Use  . Smoking status: Former Smoker    Years: 20.00    Last attempt to quit: 11/20/2009    Years since quitting: 7.3  . Smokeless  tobacco: Never Used  Substance and Sexual Activity  . Alcohol use: Yes    Comment: wine, cocktails on weekends  . Drug use: Yes    Comment: remote marijuana  . Sexual activity: Not on file  Other Topics Concern  . Not on file  Social History Narrative   Regular exercise 3 times a week   Diet: increasing fish, lean meats, low carb    Review of Systems: See HPI, otherwise negative ROS  Physical Exam: BP 95/63   Pulse 63   Temp (!) 96.5 F (35.8 C) (Tympanic)   Resp 16   Ht 5\' 4"  (1.626 m)   Wt 123 lb (55.8 kg)   SpO2 100%   BMI 21.11 kg/m  General:   Alert,  pleasant and cooperative in NAD Head:  Normocephalic and atraumatic. Neck:  Supple; no masses or thyromegaly. Lungs:  Clear throughout to auscultation, normal respiratory effort.    Heart:  +S1, +S2, Regular rate and rhythm, No edema. Abdomen:  Soft, nontender and nondistended. Normal bowel sounds, without guarding, and without rebound.   Neurologic:  Alert and  oriented x4;  grossly normal neurologically.  Impression/Plan: Natasha Dixon is here for an colonoscopy to be performed for Screening colonoscopy average risk   Risks, benefits, limitations, and alternatives regarding  colonoscopy have been reviewed with the patient.  Questions have been answered.  All parties agreeable.   Jonathon Bellows, MD  04/08/2017, 8:12 AM

## 2017-04-08 NOTE — Anesthesia Preprocedure Evaluation (Signed)
Anesthesia Evaluation  Patient identified by MRN, date of birth, ID band Patient awake    Reviewed: Allergy & Precautions, NPO status , Patient's Chart, lab work & pertinent test results  History of Anesthesia Complications Negative for: history of anesthetic complications  Airway Mallampati: II       Dental   Pulmonary neg sleep apnea, neg COPD, former smoker,           Cardiovascular (-) hypertension(-) Past MI and (-) CHF (-) dysrhythmias (-) Valvular Problems/Murmurs     Neuro/Psych Depression    GI/Hepatic Neg liver ROS, neg GERD  ,  Endo/Other  neg diabetesHypothyroidism   Renal/GU negative Renal ROS     Musculoskeletal   Abdominal   Peds  Hematology  (+) anemia ,   Anesthesia Other Findings   Reproductive/Obstetrics                             Anesthesia Physical Anesthesia Plan  ASA: II  Anesthesia Plan: General   Post-op Pain Management:    Induction: Intravenous  PONV Risk Score and Plan: 3 and TIVA, Propofol infusion and Ondansetron  Airway Management Planned: Nasal Cannula  Additional Equipment:   Intra-op Plan:   Post-operative Plan:   Informed Consent: I have reviewed the patients History and Physical, chart, labs and discussed the procedure including the risks, benefits and alternatives for the proposed anesthesia with the patient or authorized representative who has indicated his/her understanding and acceptance.     Plan Discussed with:   Anesthesia Plan Comments:         Anesthesia Quick Evaluation

## 2017-04-08 NOTE — Anesthesia Postprocedure Evaluation (Signed)
Anesthesia Post Note  Patient: Natasha Dixon  Procedure(s) Performed: COLONOSCOPY WITH PROPOFOL (N/A )  Patient location during evaluation: Endoscopy Anesthesia Type: General Level of consciousness: awake and alert Pain management: pain level controlled Vital Signs Assessment: post-procedure vital signs reviewed and stable Respiratory status: spontaneous breathing and respiratory function stable Cardiovascular status: stable Anesthetic complications: no     Last Vitals:  Vitals:   04/08/17 0752  BP: 95/63  Pulse: 63  Resp: 16  Temp: (!) 35.8 C  SpO2: 100%    Last Pain:  Vitals:   04/08/17 0752  TempSrc: Tympanic                 [name]

## 2017-04-08 NOTE — Op Note (Signed)
[facility] Gastroenterology Patient Name: Natasha Dixon Procedure Date: 04/08/2017 8:13 AM MRN: [id number] Account #: [id number] Date of Birth: 04/09/1955 Admit Type: Outpatient Age: 62 Room: [facility] ENDO ROOM 4 Gender: Female Note Status: Finalized Procedure:            Colonoscopy Indications:          Screening for colorectal malignant neoplasm Providers:            Jonathon Bellows MD, MD Referring MD:         Samantha Crimes. Demaris Callander (Referring MD) Medicines:            Monitored Anesthesia Care Complications:        No immediate complications. Procedure:            Pre-Anesthesia Assessment:                       - Prior to the procedure, a History and Physical was                        performed, and patient medications, allergies and                        sensitivities were reviewed. The patient's tolerance of                        previous anesthesia was reviewed.                       - The risks and benefits of the procedure and the                        sedation options and risks were discussed with the                        patient. All questions were answered and informed                        consent was obtained.                       - ASA Grade Assessment: II - A patient with mild                        systemic disease.                       After obtaining informed consent, the colonoscope was                        passed under direct vision. Throughout the procedure,                        the patient's blood pressure, pulse, and oxygen                        saturations were monitored continuously. The                        Colonoscope was introduced through the anus and  advanced to the the cecum, identified by the                        appendiceal orifice, IC valve and transillumination.                        The colonoscopy was performed with ease. The patient                        tolerated the procedure well. The  quality of the bowel                        preparation was good. Findings:      The perianal and digital rectal examinations were normal.      Non-bleeding internal hemorrhoids were found during retroflexion. The       hemorrhoids were medium-sized and Grade I (internal hemorrhoids that do       not prolapse).      A 3 mm polyp was found in the sigmoid colon. The polyp was sessile. The       polyp was removed with a cold biopsy forceps. Resection and retrieval       were complete.      The exam was otherwise without abnormality on direct and retroflexion       views. Impression:           - Non-bleeding internal hemorrhoids.                       - One 3 mm polyp in the sigmoid colon, removed with a                        cold biopsy forceps. Resected and retrieved.                       - The examination was otherwise normal on direct and                        retroflexion views. Recommendation:       - Discharge patient to home (with escort).                       - Resume previous diet.                       - Continue present medications.                       - Await pathology results.                       - Repeat colonoscopy in 5-10 years for surveillance                        based on pathology results. Procedure Code(s):    --- Professional ---                       [phone], Colonoscopy, flexible; with biopsy, single or                        multiple Diagnosis Code(s):    --- Professional ---  Z12.11, Encounter for screening for malignant neoplasm                        of colon                       D12.5, Benign neoplasm of sigmoid colon                       K64.0, First degree hemorrhoids CPT copyright 2016 American Medical Association. All rights reserved. The codes documented in this report are preliminary and upon coder review may  be revised to meet current compliance requirements. Jonathon Bellows, MD Jonathon Bellows MD, MD 04/08/2017 8:39:16 AM This  report has been signed electronically. Number of Addenda: 0 Note Initiated On: 04/08/2017 8:13 AM Scope Withdrawal Time: 0 hours 13 minutes 54 seconds  Total Procedure Duration: 0 hours 18 minutes 15 seconds       [facility]

## 2017-04-08 NOTE — Transfer of Care (Addendum)
Immediate Anesthesia Transfer of Care Note  Patient: Natasha Dixon  Procedure(s) Performed: COLONOSCOPY WITH PROPOFOL (N/A )  Patient Location: PACU  Anesthesia Type:General  Level of Consciousness: sedated  Airway & Oxygen Therapy: Patient connected to nasal cannula oxygen  Post-op Assessment: Report given to RN and Post -op Vital signs reviewed and stable  Post vital signs: Reviewed and stable  Last Vitals:  Vitals:   04/08/17 0752  BP: 95/63  Pulse: 63  Resp: 16  Temp: (!) 35.8 C  SpO2: 100%    Last Pain:  Vitals:   04/08/17 0752  TempSrc: Tympanic         Complications: No apparent anesthesia complications

## 2017-04-11 ENCOUNTER — Encounter: Payer: Self-pay | Admitting: Gastroenterology

## 2017-04-11 LAB — SURGICAL PATHOLOGY

## 2017-04-21 ENCOUNTER — Encounter: Payer: Self-pay | Admitting: Gastroenterology

## 2017-04-22 ENCOUNTER — Other Ambulatory Visit: Payer: Self-pay

## 2017-04-22 MED ORDER — PEG 3350-KCL-NA BICARB-NACL 420 G PO SOLR: 4000.0000 mL | Freq: Once | ORAL | 0 refills | Status: AC

## 2018-01-17 ENCOUNTER — Other Ambulatory Visit: Payer: Self-pay | Admitting: Family Medicine

## 2018-01-17 DIAGNOSIS — Z1231 Encounter for screening mammogram for malignant neoplasm of breast: Secondary | ICD-10-CM

## 2018-01-31 ENCOUNTER — Ambulatory Visit
Admission: RE | Admit: 2018-01-31 | Discharge: 2018-01-31 | Disposition: A | Discharge status: Home / Self Care | Payer: BLUE CROSS/BLUE SHIELD | Source: Ambulatory Visit | Attending: Family Medicine | Admitting: Family Medicine

## 2018-01-31 DIAGNOSIS — Z1231 Encounter for screening mammogram for malignant neoplasm of breast: Secondary | ICD-10-CM | POA: Diagnosis present

## 2018-11-07 DIAGNOSIS — E349 Endocrine disorder, unspecified: Secondary | ICD-10-CM | POA: Diagnosis not present

## 2018-11-07 DIAGNOSIS — E7800 Pure hypercholesterolemia, unspecified: Secondary | ICD-10-CM | POA: Diagnosis not present

## 2018-11-07 DIAGNOSIS — R5382 Chronic fatigue, unspecified: Secondary | ICD-10-CM | POA: Diagnosis not present

## 2018-11-07 DIAGNOSIS — E039 Hypothyroidism, unspecified: Secondary | ICD-10-CM | POA: Diagnosis not present

## 2018-11-08 DIAGNOSIS — D485 Neoplasm of uncertain behavior of skin: Secondary | ICD-10-CM | POA: Diagnosis not present

## 2018-11-08 DIAGNOSIS — L821 Other seborrheic keratosis: Secondary | ICD-10-CM | POA: Diagnosis not present

## 2018-11-08 DIAGNOSIS — D3611 Benign neoplasm of peripheral nerves and autonomic nervous system of face, head, and neck: Secondary | ICD-10-CM | POA: Diagnosis not present

## 2018-11-08 DIAGNOSIS — D1800 Hemangioma unspecified site: Secondary | ICD-10-CM | POA: Diagnosis not present

## 2018-11-08 DIAGNOSIS — L814 Other melanin hyperpigmentation: Secondary | ICD-10-CM | POA: Diagnosis not present

## 2018-11-14 DIAGNOSIS — Z23 Encounter for immunization: Secondary | ICD-10-CM | POA: Diagnosis not present

## 2018-11-14 DIAGNOSIS — Z0000 Encounter for general adult medical examination without abnormal findings: Secondary | ICD-10-CM | POA: Diagnosis not present

## 2018-11-14 DIAGNOSIS — E039 Hypothyroidism, unspecified: Secondary | ICD-10-CM | POA: Diagnosis not present

## 2018-11-24 DIAGNOSIS — Z20828 Contact with and (suspected) exposure to other viral communicable diseases: Secondary | ICD-10-CM | POA: Diagnosis not present

## 2019-03-29 DIAGNOSIS — H25813 Combined forms of age-related cataract, bilateral: Secondary | ICD-10-CM | POA: Diagnosis not present

## 2019-04-21 DIAGNOSIS — H25813 Combined forms of age-related cataract, bilateral: Secondary | ICD-10-CM | POA: Diagnosis not present

## 2019-04-21 DIAGNOSIS — Z20822 Contact with and (suspected) exposure to covid-19: Secondary | ICD-10-CM | POA: Diagnosis not present

## 2019-04-23 DIAGNOSIS — H524 Presbyopia: Secondary | ICD-10-CM | POA: Diagnosis not present

## 2019-04-23 DIAGNOSIS — H25812 Combined forms of age-related cataract, left eye: Secondary | ICD-10-CM | POA: Diagnosis not present

## 2019-05-05 DIAGNOSIS — H25813 Combined forms of age-related cataract, bilateral: Secondary | ICD-10-CM | POA: Diagnosis not present

## 2019-05-05 DIAGNOSIS — Z20822 Contact with and (suspected) exposure to covid-19: Secondary | ICD-10-CM | POA: Diagnosis not present

## 2019-05-07 DIAGNOSIS — H524 Presbyopia: Secondary | ICD-10-CM | POA: Diagnosis not present

## 2019-05-07 DIAGNOSIS — H25811 Combined forms of age-related cataract, right eye: Secondary | ICD-10-CM | POA: Diagnosis not present

## 2019-06-11 ENCOUNTER — Other Ambulatory Visit: Payer: Self-pay | Admitting: Family Medicine

## 2019-06-11 ENCOUNTER — Other Ambulatory Visit: Payer: Self-pay | Admitting: Physician Assistant

## 2019-06-11 DIAGNOSIS — Z1231 Encounter for screening mammogram for malignant neoplasm of breast: Secondary | ICD-10-CM

## 2019-08-03 DIAGNOSIS — M25552 Pain in left hip: Secondary | ICD-10-CM | POA: Diagnosis not present

## 2019-08-07 ENCOUNTER — Ambulatory Visit
Admission: RE | Admit: 2019-08-07 | Discharge: 2019-08-07 | Disposition: A | Discharge status: Home / Self Care | Payer: BC Managed Care – PPO | Source: Ambulatory Visit | Attending: Physician Assistant | Admitting: Physician Assistant

## 2019-08-07 DIAGNOSIS — Z1231 Encounter for screening mammogram for malignant neoplasm of breast: Secondary | ICD-10-CM | POA: Diagnosis not present

## 2019-08-24 DIAGNOSIS — M25552 Pain in left hip: Secondary | ICD-10-CM | POA: Diagnosis not present

## 2019-09-04 DIAGNOSIS — Z6822 Body mass index (BMI) 22.0-22.9, adult: Secondary | ICD-10-CM | POA: Diagnosis not present

## 2019-09-04 DIAGNOSIS — Z01419 Encounter for gynecological examination (general) (routine) without abnormal findings: Secondary | ICD-10-CM | POA: Diagnosis not present

## 2019-09-11 DIAGNOSIS — M25552 Pain in left hip: Secondary | ICD-10-CM | POA: Diagnosis not present

## 2019-09-18 DIAGNOSIS — M25552 Pain in left hip: Secondary | ICD-10-CM | POA: Diagnosis not present

## 2019-09-21 DIAGNOSIS — M25552 Pain in left hip: Secondary | ICD-10-CM | POA: Diagnosis not present

## 2019-09-26 DIAGNOSIS — M25552 Pain in left hip: Secondary | ICD-10-CM | POA: Diagnosis not present

## 2019-09-28 DIAGNOSIS — Z13228 Encounter for screening for other metabolic disorders: Secondary | ICD-10-CM | POA: Diagnosis not present

## 2019-09-28 DIAGNOSIS — R102 Pelvic and perineal pain: Secondary | ICD-10-CM | POA: Diagnosis not present

## 2019-09-28 DIAGNOSIS — N952 Postmenopausal atrophic vaginitis: Secondary | ICD-10-CM | POA: Diagnosis not present

## 2019-09-28 DIAGNOSIS — E039 Hypothyroidism, unspecified: Secondary | ICD-10-CM | POA: Diagnosis not present

## 2019-09-28 DIAGNOSIS — Z13220 Encounter for screening for lipoid disorders: Secondary | ICD-10-CM | POA: Diagnosis not present

## 2019-09-28 DIAGNOSIS — Z131 Encounter for screening for diabetes mellitus: Secondary | ICD-10-CM | POA: Diagnosis not present

## 2019-09-28 DIAGNOSIS — N959 Unspecified menopausal and perimenopausal disorder: Secondary | ICD-10-CM | POA: Diagnosis not present

## 2019-10-01 DIAGNOSIS — M25552 Pain in left hip: Secondary | ICD-10-CM | POA: Diagnosis not present

## 2019-10-03 DIAGNOSIS — Z13820 Encounter for screening for osteoporosis: Secondary | ICD-10-CM | POA: Diagnosis not present

## 2019-10-16 DIAGNOSIS — M25552 Pain in left hip: Secondary | ICD-10-CM | POA: Diagnosis not present

## 2019-10-16 DIAGNOSIS — X58XXXA Exposure to other specified factors, initial encounter: Secondary | ICD-10-CM | POA: Diagnosis not present

## 2019-10-16 DIAGNOSIS — M47816 Spondylosis without myelopathy or radiculopathy, lumbar region: Secondary | ICD-10-CM | POA: Diagnosis not present

## 2019-10-16 DIAGNOSIS — G5712 Meralgia paresthetica, left lower limb: Secondary | ICD-10-CM | POA: Diagnosis not present

## 2019-10-16 DIAGNOSIS — S39013A Strain of muscle, fascia and tendon of pelvis, initial encounter: Secondary | ICD-10-CM | POA: Diagnosis not present

## 2019-11-02 DIAGNOSIS — E039 Hypothyroidism, unspecified: Secondary | ICD-10-CM | POA: Diagnosis not present

## 2019-11-02 DIAGNOSIS — R944 Abnormal results of kidney function studies: Secondary | ICD-10-CM | POA: Diagnosis not present

## 2019-11-02 DIAGNOSIS — N959 Unspecified menopausal and perimenopausal disorder: Secondary | ICD-10-CM | POA: Diagnosis not present

## 2020-01-24 DIAGNOSIS — Z20822 Contact with and (suspected) exposure to covid-19: Secondary | ICD-10-CM | POA: Diagnosis not present

## 2020-01-24 DIAGNOSIS — Z03818 Encounter for observation for suspected exposure to other biological agents ruled out: Secondary | ICD-10-CM | POA: Diagnosis not present

## 2020-02-14 DIAGNOSIS — E785 Hyperlipidemia, unspecified: Secondary | ICD-10-CM | POA: Diagnosis not present

## 2020-02-14 DIAGNOSIS — Z23 Encounter for immunization: Secondary | ICD-10-CM | POA: Diagnosis not present

## 2020-04-24 DIAGNOSIS — R109 Unspecified abdominal pain: Secondary | ICD-10-CM | POA: Diagnosis not present

## 2020-04-24 DIAGNOSIS — M7918 Myalgia, other site: Secondary | ICD-10-CM | POA: Diagnosis not present

## 2020-05-08 ENCOUNTER — Other Ambulatory Visit: Payer: Self-pay | Admitting: Internal Medicine

## 2020-05-08 ENCOUNTER — Ambulatory Visit
Admission: RE | Admit: 2020-05-08 | Discharge: 2020-05-08 | Disposition: A | Discharge status: Home / Self Care | Payer: BC Managed Care – PPO | Source: Ambulatory Visit | Attending: Internal Medicine | Admitting: Internal Medicine

## 2020-05-08 DIAGNOSIS — N200 Calculus of kidney: Secondary | ICD-10-CM | POA: Diagnosis not present

## 2020-05-08 DIAGNOSIS — R109 Unspecified abdominal pain: Secondary | ICD-10-CM

## 2020-05-08 DIAGNOSIS — R319 Hematuria, unspecified: Secondary | ICD-10-CM | POA: Diagnosis not present

## 2020-05-15 DIAGNOSIS — M7521 Bicipital tendinitis, right shoulder: Secondary | ICD-10-CM | POA: Diagnosis not present

## 2020-05-15 DIAGNOSIS — M25511 Pain in right shoulder: Secondary | ICD-10-CM | POA: Diagnosis not present

## 2020-05-15 DIAGNOSIS — M7551 Bursitis of right shoulder: Secondary | ICD-10-CM | POA: Diagnosis not present

## 2020-06-20 DIAGNOSIS — N200 Calculus of kidney: Secondary | ICD-10-CM | POA: Diagnosis not present

## 2020-06-20 DIAGNOSIS — D49512 Neoplasm of unspecified behavior of left kidney: Secondary | ICD-10-CM | POA: Diagnosis not present

## 2020-06-20 DIAGNOSIS — R8271 Bacteriuria: Secondary | ICD-10-CM | POA: Diagnosis not present

## 2020-06-20 DIAGNOSIS — R311 Benign essential microscopic hematuria: Secondary | ICD-10-CM | POA: Diagnosis not present

## 2020-06-30 DIAGNOSIS — M9902 Segmental and somatic dysfunction of thoracic region: Secondary | ICD-10-CM | POA: Diagnosis not present

## 2020-06-30 DIAGNOSIS — M542 Cervicalgia: Secondary | ICD-10-CM | POA: Diagnosis not present

## 2020-06-30 DIAGNOSIS — M9901 Segmental and somatic dysfunction of cervical region: Secondary | ICD-10-CM | POA: Diagnosis not present

## 2020-06-30 DIAGNOSIS — M9905 Segmental and somatic dysfunction of pelvic region: Secondary | ICD-10-CM | POA: Diagnosis not present

## 2020-07-01 DIAGNOSIS — M9901 Segmental and somatic dysfunction of cervical region: Secondary | ICD-10-CM | POA: Diagnosis not present

## 2020-07-01 DIAGNOSIS — M9905 Segmental and somatic dysfunction of pelvic region: Secondary | ICD-10-CM | POA: Diagnosis not present

## 2020-07-01 DIAGNOSIS — M9902 Segmental and somatic dysfunction of thoracic region: Secondary | ICD-10-CM | POA: Diagnosis not present

## 2020-07-01 DIAGNOSIS — M542 Cervicalgia: Secondary | ICD-10-CM | POA: Diagnosis not present

## 2020-07-08 DIAGNOSIS — R311 Benign essential microscopic hematuria: Secondary | ICD-10-CM | POA: Diagnosis not present

## 2020-07-08 DIAGNOSIS — N281 Cyst of kidney, acquired: Secondary | ICD-10-CM | POA: Diagnosis not present

## 2020-07-08 DIAGNOSIS — N200 Calculus of kidney: Secondary | ICD-10-CM | POA: Diagnosis not present

## 2020-07-09 DIAGNOSIS — M9905 Segmental and somatic dysfunction of pelvic region: Secondary | ICD-10-CM | POA: Diagnosis not present

## 2020-07-09 DIAGNOSIS — M542 Cervicalgia: Secondary | ICD-10-CM | POA: Diagnosis not present

## 2020-07-09 DIAGNOSIS — M9902 Segmental and somatic dysfunction of thoracic region: Secondary | ICD-10-CM | POA: Diagnosis not present

## 2020-07-09 DIAGNOSIS — M9901 Segmental and somatic dysfunction of cervical region: Secondary | ICD-10-CM | POA: Diagnosis not present

## 2020-07-24 DIAGNOSIS — M9902 Segmental and somatic dysfunction of thoracic region: Secondary | ICD-10-CM | POA: Diagnosis not present

## 2020-07-24 DIAGNOSIS — M542 Cervicalgia: Secondary | ICD-10-CM | POA: Diagnosis not present

## 2020-07-24 DIAGNOSIS — M9901 Segmental and somatic dysfunction of cervical region: Secondary | ICD-10-CM | POA: Diagnosis not present

## 2020-07-24 DIAGNOSIS — M9905 Segmental and somatic dysfunction of pelvic region: Secondary | ICD-10-CM | POA: Diagnosis not present

## 2020-08-04 DIAGNOSIS — M9905 Segmental and somatic dysfunction of pelvic region: Secondary | ICD-10-CM | POA: Diagnosis not present

## 2020-08-04 DIAGNOSIS — M9902 Segmental and somatic dysfunction of thoracic region: Secondary | ICD-10-CM | POA: Diagnosis not present

## 2020-08-04 DIAGNOSIS — M9901 Segmental and somatic dysfunction of cervical region: Secondary | ICD-10-CM | POA: Diagnosis not present

## 2020-08-04 DIAGNOSIS — M542 Cervicalgia: Secondary | ICD-10-CM | POA: Diagnosis not present

## 2021-05-29 ENCOUNTER — Other Ambulatory Visit: Payer: Self-pay | Admitting: Internal Medicine

## 2021-06-01 ENCOUNTER — Other Ambulatory Visit: Payer: Self-pay | Admitting: Internal Medicine

## 2021-06-01 DIAGNOSIS — I700 Atherosclerosis of aorta: Secondary | ICD-10-CM

## 2021-06-09 ENCOUNTER — Ambulatory Visit
Admission: RE | Admit: 2021-06-09 | Discharge: 2021-06-09 | Disposition: A | Discharge status: Home / Self Care | Payer: Medicare Other | Source: Ambulatory Visit | Attending: Internal Medicine | Admitting: Internal Medicine

## 2021-06-09 DIAGNOSIS — I700 Atherosclerosis of aorta: Secondary | ICD-10-CM

## 2022-03-21 ENCOUNTER — Other Ambulatory Visit: Payer: Self-pay

## 2022-03-21 ENCOUNTER — Encounter
Admission: EM | Disposition: A | Discharge status: Home / Self Care | Payer: Self-pay | Source: Home / Self Care | Attending: Emergency Medicine

## 2022-03-21 ENCOUNTER — Emergency Department: Payer: Medicare Other

## 2022-03-21 ENCOUNTER — Observation Stay
Admission: EM | Admit: 2022-03-21 | Discharge: 2022-03-22 | Disposition: A | Discharge status: Home / Self Care | Payer: Medicare Other | Attending: General Surgery | Admitting: General Surgery

## 2022-03-21 ENCOUNTER — Observation Stay: Payer: Medicare Other | Admitting: Anesthesiology

## 2022-03-21 ENCOUNTER — Encounter: Payer: Self-pay | Admitting: General Surgery

## 2022-03-21 DIAGNOSIS — I10 Essential (primary) hypertension: Secondary | ICD-10-CM | POA: Insufficient documentation

## 2022-03-21 DIAGNOSIS — R1031 Right lower quadrant pain: Secondary | ICD-10-CM | POA: Diagnosis present

## 2022-03-21 DIAGNOSIS — Z87891 Personal history of nicotine dependence: Secondary | ICD-10-CM | POA: Diagnosis not present

## 2022-03-21 DIAGNOSIS — K3580 Unspecified acute appendicitis: Secondary | ICD-10-CM

## 2022-03-21 DIAGNOSIS — K353 Acute appendicitis with localized peritonitis, without perforation or gangrene: Principal | ICD-10-CM | POA: Diagnosis present

## 2022-03-21 DIAGNOSIS — Z79899 Other long term (current) drug therapy: Secondary | ICD-10-CM | POA: Diagnosis not present

## 2022-03-21 HISTORY — PX: XI ROBOTIC LAPAROSCOPIC ASSISTED APPENDECTOMY: SHX6877

## 2022-03-21 LAB — COMPREHENSIVE METABOLIC PANEL
ALT: 22 U/L (ref 0–44)
AST: 27 U/L (ref 15–41)
Albumin: 4.4 g/dL (ref 3.5–5.0)
Alkaline Phosphatase: 40 U/L (ref 38–126)
Anion gap: 11 (ref 5–15)
BUN: 16 mg/dL (ref 8–23)
CO2: 23 mmol/L (ref 22–32)
Calcium: 9.3 mg/dL (ref 8.9–10.3)
Chloride: 103 mmol/L (ref 98–111)
Creatinine, Ser: 0.79 mg/dL (ref 0.44–1.00)
GFR, Estimated: >60 mL/min (ref >60)
Glucose, Bld: 131 mg/dL — ABNORMAL HIGH (ref 70–99)
Potassium: 3.8 mmol/L (ref 3.5–5.1)
Sodium: 137 mmol/L (ref 135–145)
Total Bilirubin: 0.7 mg/dL (ref 0.3–1.2)
Total Protein: 7.8 g/dL (ref 6.5–8.1)

## 2022-03-21 LAB — CBC
HCT: 39.6 % (ref 36.0–46.0)
Hemoglobin: 13 g/dL (ref 12.0–15.0)
MCH: 31 pg (ref 26.0–34.0)
MCHC: 32.8 g/dL (ref 30.0–36.0)
MCV: 94.3 fL (ref 80.0–100.0)
Platelets: 212 10*3/uL (ref 150–400)
RBC: 4.2 MIL/uL (ref 3.87–5.11)
RDW: 13.2 % (ref 11.5–15.5)
WBC: 13 10*3/uL — ABNORMAL HIGH (ref 4.0–10.5)
nRBC: 0 % (ref 0.0–0.2)

## 2022-03-21 LAB — LIPASE, BLOOD: Lipase: 40 U/L (ref 11–51)

## 2022-03-21 SURGERY — APPENDECTOMY, ROBOT-ASSISTED, LAPAROSCOPIC
Anesthesia: General

## 2022-03-21 MED ORDER — PHENYLEPHRINE HCL (PRESSORS) 10 MG/ML IV SOLN
INTRAVENOUS | Status: DC | PRN
  Administered 2022-03-21: 80 ug via INTRAVENOUS

## 2022-03-21 MED ORDER — LEVOTHYROXINE SODIUM 50 MCG PO TABS
50.0000 ug | ORAL_TABLET | Freq: Every day | ORAL | Status: DC
  Administered 2022-03-22: 50 ug via ORAL
  Filled 2022-03-21: qty 1

## 2022-03-21 MED ORDER — PIPERACILLIN-TAZOBACTAM 3.375 G IVPB 30 MIN
3.3750 g | Freq: Once | INTRAVENOUS | Status: AC
  Administered 2022-03-21: 3.375 g via INTRAVENOUS
  Filled 2022-03-21: qty 50

## 2022-03-21 MED ORDER — LIDOCAINE HCL (CARDIAC) PF 100 MG/5ML IV SOSY
PREFILLED_SYRINGE | INTRAVENOUS | Status: DC | PRN
  Administered 2022-03-21: 60 mg via INTRAVENOUS

## 2022-03-21 MED ORDER — ACETAMINOPHEN 650 MG RE SUPP: 650.0000 mg | Freq: Four times a day (QID) | RECTAL | Status: DC | PRN

## 2022-03-21 MED ORDER — HYDROCODONE-ACETAMINOPHEN 5-325 MG PO TABS
1.0000 | ORAL_TABLET | ORAL | Status: DC | PRN
  Administered 2022-03-21: 2 via ORAL
  Administered 2022-03-21 – 2022-03-22 (×2): 1 via ORAL
  Filled 2022-03-21: qty 1
  Filled 2022-03-21: qty 2
  Filled 2022-03-21: qty 1

## 2022-03-21 MED ORDER — BUPIVACAINE-EPINEPHRINE (PF) 0.5% -1:200000 IJ SOLN
INTRAMUSCULAR | Status: DC | PRN
  Administered 2022-03-21: 30 mL

## 2022-03-21 MED ORDER — ONDANSETRON HCL 4 MG/2ML IJ SOLN: 4.0000 mg | Freq: Four times a day (QID) | INTRAMUSCULAR | Status: DC | PRN

## 2022-03-21 MED ORDER — ONDANSETRON 4 MG PO TBDP: 4.0000 mg | ORAL_TABLET | Freq: Four times a day (QID) | ORAL | Status: DC | PRN

## 2022-03-21 MED ORDER — ESTRADIOL 0.5 MG PO TABS
0.5000 mg | ORAL_TABLET | Freq: Every day | ORAL | Status: DC
  Filled 2022-03-21 (×2): qty 1

## 2022-03-21 MED ORDER — SODIUM CHLORIDE 0.9 % IV SOLN: INTRAVENOUS | Status: DC

## 2022-03-21 MED ORDER — SODIUM CHLORIDE 0.9 % IV SOLN: Freq: Once | INTRAVENOUS | Status: AC

## 2022-03-21 MED ORDER — ACETAMINOPHEN 325 MG PO TABS: 650.0000 mg | ORAL_TABLET | Freq: Four times a day (QID) | ORAL | Status: DC | PRN

## 2022-03-21 MED ORDER — MORPHINE SULFATE (PF) 4 MG/ML IV SOLN: 4.0000 mg | INTRAVENOUS | Status: DC | PRN

## 2022-03-21 MED ORDER — SUGAMMADEX SODIUM 200 MG/2ML IV SOLN
INTRAVENOUS | Status: DC | PRN
  Administered 2022-03-21: 150 mg via INTRAVENOUS

## 2022-03-21 MED ORDER — HYDROMORPHONE HCL 1 MG/ML IJ SOLN
0.5000 mg | Freq: Once | INTRAMUSCULAR | Status: AC
  Administered 2022-03-21: 0.5 mg via INTRAVENOUS
  Filled 2022-03-21: qty 0.5

## 2022-03-21 MED ORDER — FIBRIN SEALANT 2 ML SINGLE DOSE KIT
PACK | CUTANEOUS | Status: DC | PRN
  Administered 2022-03-21: 2 mL via TOPICAL

## 2022-03-21 MED ORDER — MORPHINE SULFATE (PF) 4 MG/ML IV SOLN
4.0000 mg | Freq: Once | INTRAVENOUS | Status: AC
  Administered 2022-03-21: 4 mg via INTRAVENOUS
  Filled 2022-03-21: qty 1

## 2022-03-21 MED ORDER — ONDANSETRON HCL 4 MG/2ML IJ SOLN
INTRAMUSCULAR | Status: DC | PRN
  Administered 2022-03-21: 4 mg via INTRAVENOUS

## 2022-03-21 MED ORDER — ENOXAPARIN SODIUM 40 MG/0.4ML IJ SOSY: 40.0000 mg | PREFILLED_SYRINGE | INTRAMUSCULAR | Status: DC

## 2022-03-21 MED ORDER — DEXAMETHASONE SODIUM PHOSPHATE 10 MG/ML IJ SOLN
INTRAMUSCULAR | Status: DC | PRN
  Administered 2022-03-21: 8 mg via INTRAVENOUS

## 2022-03-21 MED ORDER — FENTANYL CITRATE (PF) 100 MCG/2ML IJ SOLN
INTRAMUSCULAR | Status: AC
  Filled 2022-03-21: qty 2

## 2022-03-21 MED ORDER — PROPOFOL 10 MG/ML IV BOLUS
INTRAVENOUS | Status: DC | PRN
  Administered 2022-03-21: 100 mg via INTRAVENOUS

## 2022-03-21 MED ORDER — INFLUENZA VAC A&B SA ADJ QUAD 0.5 ML IM PRSY: 0.5000 mL | PREFILLED_SYRINGE | INTRAMUSCULAR | Status: DC | PRN

## 2022-03-21 MED ORDER — ONDANSETRON 4 MG PO TBDP
4.0000 mg | ORAL_TABLET | Freq: Once | ORAL | Status: AC | PRN
  Administered 2022-03-21: 4 mg via ORAL
  Filled 2022-03-21: qty 1

## 2022-03-21 MED ORDER — SUCCINYLCHOLINE CHLORIDE 200 MG/10ML IV SOSY
PREFILLED_SYRINGE | INTRAVENOUS | Status: DC | PRN
  Administered 2022-03-21: 60 mg via INTRAVENOUS

## 2022-03-21 MED ORDER — PIPERACILLIN-TAZOBACTAM 3.375 G IVPB
3.3750 g | Freq: Three times a day (TID) | INTRAVENOUS | Status: DC
  Administered 2022-03-21 – 2022-03-22 (×3): 3.375 g via INTRAVENOUS
  Filled 2022-03-21 (×2): qty 50

## 2022-03-21 MED ORDER — ROCURONIUM BROMIDE 100 MG/10ML IV SOLN
INTRAVENOUS | Status: DC | PRN
  Administered 2022-03-21: 20 mg via INTRAVENOUS

## 2022-03-21 MED ORDER — FENTANYL CITRATE (PF) 100 MCG/2ML IJ SOLN
INTRAMUSCULAR | Status: DC | PRN
  Administered 2022-03-21: 50 ug via INTRAVENOUS
  Administered 2022-03-21: 25 ug via INTRAVENOUS

## 2022-03-21 SURGICAL SUPPLY — 64 items
APPLICATOR VISTASEAL 35 (MISCELLANEOUS) IMPLANT
BAG PRESSURE INF REUSE 1000 (BAG) IMPLANT
BLADE SURG SZ11 CARB STEEL (BLADE) ×1 IMPLANT
CANNULA REDUC XI 12-8 STAPL (CANNULA) ×1
CANNULA REDUCER 12-8 DVNC XI (CANNULA) ×1 IMPLANT
COVER TIP SHEARS 8 DVNC (MISCELLANEOUS) ×1 IMPLANT
COVER TIP SHEARS 8MM DA VINCI (MISCELLANEOUS) ×1
DERMABOND ADVANCED .7 DNX12 (GAUZE/BANDAGES/DRESSINGS) ×1 IMPLANT
DRAPE ARM DVNC X/XI (DISPOSABLE) ×4 IMPLANT
DRAPE COLUMN DVNC XI (DISPOSABLE) ×1 IMPLANT
DRAPE DA VINCI XI ARM (DISPOSABLE) ×4
DRAPE DA VINCI XI COLUMN (DISPOSABLE) ×1
ELECT REM PT RETURN 9FT ADLT (ELECTROSURGICAL) ×1
ELECTRODE REM PT RTRN 9FT ADLT (ELECTROSURGICAL) ×1 IMPLANT
GLOVE BIOGEL PI IND STRL 6.5 (GLOVE) ×2 IMPLANT
GLOVE SURG SYN 6.5 ES PF (GLOVE) ×2 IMPLANT
GLOVE SURG SYN 6.5 PF PI (GLOVE) ×2 IMPLANT
GOWN STRL REUS W/ TWL LRG LVL3 (GOWN DISPOSABLE) ×3 IMPLANT
GOWN STRL REUS W/TWL LRG LVL3 (GOWN DISPOSABLE) ×3
GRASPER SUT TROCAR 14GX15 (MISCELLANEOUS) IMPLANT
IRRIGATOR SUCT 8 DISP DVNC XI (IRRIGATION / IRRIGATOR) IMPLANT
IRRIGATOR SUCTION 8MM XI DISP (IRRIGATION / IRRIGATOR)
IV NS 1000ML (IV SOLUTION)
IV NS 1000ML BAXH (IV SOLUTION) IMPLANT
KIT PINK PAD W/HEAD ARE REST (MISCELLANEOUS) ×1 IMPLANT
KIT PINK PAD W/HEAD ARM REST (MISCELLANEOUS) ×1 IMPLANT
MANIFOLD NEPTUNE II (INSTRUMENTS) ×1 IMPLANT
NDL INSUFFLATION 14GA 120MM (NEEDLE) ×1 IMPLANT
NEEDLE HYPO 22GX1.5 SAFETY (NEEDLE) ×1 IMPLANT
NEEDLE INSUFFLATION 14GA 120MM (NEEDLE) ×1 IMPLANT
OBTURATOR OPTICAL STANDARD 8MM (TROCAR) ×1
OBTURATOR OPTICAL STND 8 DVNC (TROCAR) ×1
OBTURATOR OPTICALSTD 8 DVNC (TROCAR) ×1 IMPLANT
PACK LAP CHOLECYSTECTOMY (MISCELLANEOUS) ×1 IMPLANT
RELOAD STAPLE 45 2.5 WHT DVNC (STAPLE) IMPLANT
RELOAD STAPLE 45 3.5 BLU DVNC (STAPLE) IMPLANT
RELOAD STAPLER 2.5X45 WHT DVNC (STAPLE) IMPLANT
RELOAD STAPLER 3.5X45 BLU DVNC (STAPLE) IMPLANT
SEAL CANN UNIV 5-8 DVNC XI (MISCELLANEOUS) ×3 IMPLANT
SEAL XI 5MM-8MM UNIVERSAL (MISCELLANEOUS) ×3
SEALER VESSEL DA VINCI XI (MISCELLANEOUS)
SEALER VESSEL EXT DVNC XI (MISCELLANEOUS) IMPLANT
SET TUBE SMOKE EVAC HIGH FLOW (TUBING) ×1 IMPLANT
SOLUTION ELECTROLUBE (MISCELLANEOUS) ×1 IMPLANT
SPONGE T-LAP 4X18 ~~LOC~~+RFID (SPONGE) ×1 IMPLANT
STAPLER 45 DA VINCI SURE FORM (STAPLE)
STAPLER 45 SUREFORM DVNC (STAPLE) IMPLANT
STAPLER CANNULA SEAL DVNC XI (STAPLE) ×1 IMPLANT
STAPLER CANNULA SEAL XI (STAPLE) ×1
STAPLER RELOAD 2.5X45 WHITE (STAPLE)
STAPLER RELOAD 2.5X45 WHT DVNC (STAPLE)
STAPLER RELOAD 3.5X45 BLU DVNC (STAPLE)
STAPLER RELOAD 3.5X45 BLUE (STAPLE)
SUT MNCRL AB 4-0 PS2 18 (SUTURE) ×1 IMPLANT
SUT VIC AB 2-0 SH 27 (SUTURE) ×1
SUT VIC AB 2-0 SH 27XBRD (SUTURE) ×1 IMPLANT
SUT VICRYL 0 UR6 27IN ABS (SUTURE) ×1 IMPLANT
SUT VLOC 90 6 CV-15 VIOLET (SUTURE) ×1 IMPLANT
SYR 30ML LL (SYRINGE) ×1 IMPLANT
SYS BAG RETRIEVAL 10MM (BASKET) ×1
SYSTEM BAG RETRIEVAL 10MM (BASKET) ×1 IMPLANT
TRAP FLUID SMOKE EVACUATOR (MISCELLANEOUS) ×1 IMPLANT
TRAY FOLEY MTR SLVR 16FR STAT (SET/KITS/TRAYS/PACK) IMPLANT
WATER STERILE IRR 500ML POUR (IV SOLUTION) ×1 IMPLANT

## 2022-03-21 NOTE — Anesthesia Preprocedure Evaluation (Addendum)
Anesthesia Evaluation  Patient identified by MRN, date of birth, ID band Patient awake    Reviewed: Allergy & Precautions, NPO status , Patient's Chart, lab work & pertinent test results  History of Anesthesia Complications Negative for: history of anesthetic complications  Airway Mallampati: III   Neck ROM: Full    Dental no notable dental hx.    Pulmonary former smoker (quit 2011) COVID+ 5 days ago   Pulmonary exam normal breath sounds clear to auscultation       Cardiovascular Exercise Tolerance: Good negative cardio ROS Normal cardiovascular exam Rhythm:Regular Rate:Normal     Neuro/Psych  PSYCHIATRIC DISORDERS  Depression    negative neurological ROS     GI/Hepatic negative GI ROS,,,  Endo/Other  Hypothyroidism    Renal/GU negative Renal ROS     Musculoskeletal   Abdominal   Peds  Hematology  (+) Blood dyscrasia, anemia   Anesthesia Other Findings   Reproductive/Obstetrics                             Anesthesia Physical Anesthesia Plan  ASA: 3 and emergent  Anesthesia Plan: General   Post-op Pain Management:    Induction: Intravenous and Rapid sequence  PONV Risk Score and Plan: 3 and Ondansetron, Dexamethasone and Treatment may vary due to age or medical condition  Airway Management Planned: Oral ETT  Additional Equipment:   Intra-op Plan:   Post-operative Plan: Extubation in OR  Informed Consent: I have reviewed the patients History and Physical, chart, labs and discussed the procedure including the risks, benefits and alternatives for the proposed anesthesia with the patient or authorized representative who has indicated his/her understanding and acceptance.     Dental advisory given  Plan Discussed with: CRNA  Anesthesia Plan Comments: (Patient consented for risks of anesthesia including but not limited to:  - adverse reactions to medications - damage to  eyes, teeth, lips or other oral mucosa - nerve damage due to positioning  - sore throat or hoarseness - damage to heart, brain, nerves, lungs, other parts of body or loss of life  Informed patient about role of CRNA in peri- and intra-operative care.  Patient voiced understanding.)       Anesthesia Quick Evaluation

## 2022-03-21 NOTE — ED Provider Notes (Signed)
[facility] Provider Note    Event Date/Time   First MD Initiated Contact with Patient 03/21/22 [phone]     (approximate)   History   Abdominal Pain   HPI  Natasha Dixon is a 67 y.o. female who presents via EMS from home with complaints of right lower quadrant pain that radiates to her right flank.  Patient reports that this began last night around 11 PM.  She reports that she had chili at around 8 PM.  She reports that she was diagnosed with COVID 5 days ago.  Her husband also has COVID.  She thought that she was getting better as her cough and nasal congestion had improved, and then she developed abdominal pain.  She denies any difficulty urinating.  She reports that she has had fevers and chills but she is unsure if that is related to her COVID.  She has not taken any antipyretics today.  She has not had anything to eat today.  Patient Active Problem List   Diagnosis Date Noted   Acute appendicitis with localized peritonitis 03/21/2022   Impingement syndrome of shoulder region 04/19/2016   Leukopenia    Iron deficiency anemia    Leucopenia 10/15/2011   Fatigue 10/15/2011   JAW PAIN 01/16/2009   POSTMENOPAUSAL STATUS 06/26/2008   HERPES SIMPLEX INFECTION 05/18/2007   HYPERLIPIDEMIA 05/18/2007   ANEMIA-IRON DEFICIENCY 05/18/2007   Hx of DEPRESSION 05/18/2007   ALLERGIC RHINITIS 05/18/2007          Physical Exam   Triage Vital Signs: ED Triage Vitals  Enc Vitals Group     BP 03/21/22 0645 117/70     Pulse Rate 03/21/22 0645 84     Resp 03/21/22 0645 18     Temp 03/21/22 0645 98.5 F (36.9 C)     Temp Source 03/21/22 0645 Oral     SpO2 03/21/22 0645 100 %     Weight 03/21/22 0639 125 lb (56.7 kg)     Height 03/21/22 0639 '5\' 4"'$  (1.626 m)     Head Circumference --      Peak Flow --      Pain Score 03/21/22 0642 8     Pain Loc --      Pain Edu? --      Excl. in [place]? --     Most recent vital signs: Vitals:   03/21/22 0645  BP:  117/70  Pulse: 84  Resp: 18  Temp: 98.5 F (36.9 C)  SpO2: 100%    Physical Exam Vitals and nursing note reviewed.  Constitutional:      General: Awake and alert. No acute distress.    Appearance: Normal appearance. The patient is normal weight.  HENT:     Head: Normocephalic and atraumatic.     Mouth: Mucous membranes are moist.  Eyes:     General: PERRL. Normal EOMs        Right eye: No discharge.        Left eye: No discharge.     Conjunctiva/sclera: Conjunctivae normal.  Cardiovascular:     Rate and Rhythm: Normal rate and regular rhythm.     Pulses: Normal pulses.  Pulmonary:     Effort: Pulmonary effort is normal. No respiratory distress.     Breath sounds: Normal breath sounds.  Abdominal:     Abdomen is soft. There is right lower quadrant abdominal tenderness. No rebound or guarding. No distention.  Positive Rovsing sign Musculoskeletal:  General: No swelling. Normal range of motion.     Cervical back: Normal range of motion and neck supple.  Skin:    General: Skin is warm and dry.     Capillary Refill: Capillary refill takes less than 2 seconds.     Findings: No rash.  Neurological:     Mental Status: The patient is awake and alert.      ED Results / Procedures / Treatments   Labs (all labs ordered are listed, but only abnormal results are displayed) Labs Reviewed  COMPREHENSIVE METABOLIC PANEL - Abnormal; Notable for the following components:      Result Value   Glucose, Bld 131 (*)    All other components within normal limits  CBC - Abnormal; Notable for the following components:   WBC 13.0 (*)    All other components within normal limits  LIPASE, BLOOD  URINALYSIS, ROUTINE W REFLEX MICROSCOPIC  HIV ANTIBODY (ROUTINE TESTING W REFLEX)     EKG     RADIOLOGY I independently reviewed and interpreted imaging and agree with radiologists findings.     PROCEDURES:  Critical Care performed:   Procedures   MEDICATIONS ORDERED IN  ED: Medications  estradiol (ESTRACE) tablet 0.5 mg (0 mg Oral Hold 03/21/22 0906)  levothyroxine (SYNTHROID) tablet 50 mcg (has no administration in time range)  piperacillin-tazobactam (ZOSYN) IVPB 3.375 g (has no administration in time range)  enoxaparin (LOVENOX) injection 40 mg (0 mg Subcutaneous Hold 03/21/22 0906)  0.9 %  sodium chloride infusion ( Intravenous New Bag/Given 03/21/22 0849)  acetaminophen (TYLENOL) tablet 650 mg (has no administration in time range)    Or  acetaminophen (TYLENOL) suppository 650 mg (has no administration in time range)  HYDROcodone-acetaminophen (NORCO/VICODIN) 5-325 MG per tablet 1-2 tablet (has no administration in time range)  morphine (PF) 4 MG/ML injection 4 mg (has no administration in time range)  ondansetron (ZOFRAN-ODT) disintegrating tablet 4 mg (has no administration in time range)    Or  ondansetron (ZOFRAN) injection 4 mg (has no administration in time range)  ondansetron (ZOFRAN-ODT) disintegrating tablet 4 mg (4 mg Oral Given 03/21/22 0650)  piperacillin-tazobactam (ZOSYN) IVPB 3.375 g (0 g Intravenous Stopped 03/21/22 0901)  morphine (PF) 4 MG/ML injection 4 mg (4 mg Intravenous Given 03/21/22 0827)     IMPRESSION / MDM / ASSESSMENT AND PLAN / ED COURSE  I reviewed the triage vital signs and the nursing notes.   Differential diagnosis includes, but is not limited to, appendicitis, nephrolithiasis, UTI/pyelonephritis, COVID-19, influenza.  Patient is awake and alert, hemodynamically stable and afebrile.  She is got easily reproducible tenderness in her right lower quadrant with a white count of 15.  CT renal obtained and ultrasound is positive for acute uncomplicated appendicitis.  Patient was made n.p.o., started on broad-spectrum antibiotics, and case was discussed with general surgery Dr. Peyton Najjar who has agreed to admit the patient for operative intervention.  Patient and husband understand and agree with plan.  She was admitted to the surgical  service.   Patient's presentation is most consistent with acute presentation with potential threat to life or bodily function.   Clinical Course as of 03/21/22 1022  Sun Mar 21, 2022  0809 Discussed with Dr. Peyton Najjar who will come evaluate her [JP]    Clinical Course User Index [JP] Mareena Cavan, Clarnce Flock, PA-C     FINAL CLINICAL IMPRESSION(S) / ED DIAGNOSES   Final diagnoses:  Acute appendicitis, unspecified acute appendicitis type     Rx / DC  Orders   ED Discharge Orders     None        Note:  This document was prepared using Dragon voice recognition software and may include unintentional dictation errors.   Marquette Old, PA-C 03/21/22 1022    Nathaniel Man, MD 03/21/22 [phone]

## 2022-03-21 NOTE — Op Note (Signed)
Pre-op Diagnosis: Acute appendicitis   Post op Diagnosis: Acute appenditicis  Procedure: Robotic assisted laparoscopic appendectomy.  Anesthesia: GETA  Surgeon: Herbert Pun, MD, FACS  Wound Classification: clean contaminated  Specimen: Appendix  Complications: None  Estimated Blood Loss: 3 mL   Indications: Patient is a 67 y.o. female  presented with above right lower quadrant pain. CT scan shows acute appendicitis.     FIndings: 1. Suppurative appendix 2. No peri-appendiceal abscess or phlegmon 3. Normal anatomy 4. Adequate hemostasis.   Description of procedure: The patient was placed on the operating table in the supine position. General anesthesia was induced. A time-out was completed verifying correct patient, procedure, site, positioning, and implant(s) and/or special equipment prior to beginning this procedure. The abdomen was prepped and draped in the usual sterile fashion.   Palmer's point located and Veress needle was inserted.  After confirming 2 clicks and a positive saline drop test, gas insufflation was initiated until the abdominal pressure was measured at 15 mmHg.  Afterwards, the Veress needle was removed and a 8 mm port was placed in left upper quadrant area using Optiview technique.  After local was infused, 3 additional incision on the left abdominal wall were made 5 cm apart.  An 12 mm port and two other 8 mm ports were placed under direct visualization.  No injuries from trocar placements were noted.  The table was placed in the Trendelenburg position with the right side elevated.  With the use of Tip up grasper, fenestrated bipolar and monopolar scissors, an inflamed appendix was identified and elevated.  Window created at base of appendix in the mesentery.    The mesoappendix was divided with combination of bipolar energy and monopolar scissors.  The base of the appendix was ligated with 3-0 V-Loc.  The appendix was divided with monopolar scissors.  A  second layer of the 3 oh V-Loc was done over the appendiceal stump.  The appendiceal stump was examined and hemostasis noted. No other pathology was identified within pelvis. The 12 mm trocar removed and port site closed with PMI using 0 vicryl under direct vision. Remaining trocars were removed under direct vision. No bleeding was noted.The abdomen was allowed to collapse.  All skin incisions then closed with subcuticular sutures Monocryl 4-0.  Wounds then dressed with dermabond.  The patient tolerated the procedure well, awakened from anesthesia and was taken to the postanesthesia care unit in satisfactory condition.  Sponge count and instrument count correct at the end of the procedure.

## 2022-03-21 NOTE — Anesthesia Postprocedure Evaluation (Signed)
Anesthesia Post Note  Patient: Natasha Dixon  Procedure(s) Performed: XI ROBOTIC LAPAROSCOPIC ASSISTED APPENDECTOMY  Patient location during evaluation: Nursing Unit Anesthesia Type: General Level of consciousness: awake and alert, oriented and patient cooperative Pain management: pain level controlled Vital Signs Assessment: post-procedure vital signs reviewed and stable Respiratory status: spontaneous breathing, nonlabored ventilation and respiratory function stable Cardiovascular status: blood pressure returned to baseline and stable Postop Assessment: adequate PO intake Anesthetic complications: no   No notable events documented.   Last Vitals:  Vitals:   03/21/22 1538 03/21/22 1540  BP: (!) 83/57 (!) 89/55  Pulse: 74 75  Resp: 18   Temp: 36.9 C   SpO2: 95%     Last Pain:  Vitals:   03/21/22 1420  TempSrc:   PainSc: [place]

## 2022-03-21 NOTE — Anesthesia Procedure Notes (Signed)
Procedure Name: Intubation Date/Time: 03/21/2022 12:36 PM  Performed by: Johnna Acosta, CRNAPre-anesthesia Checklist: Patient identified, Emergency Drugs available, Suction available, Patient being monitored and Timeout performed Patient Re-evaluated:Patient Re-evaluated prior to induction Oxygen Delivery Method: Circle system utilized Preoxygenation: Pre-oxygenation with 100% oxygen Induction Type: IV induction and Rapid sequence Laryngoscope Size: McGraph and 3 Grade View: Grade I Tube type: Oral Tube size: 6.5 mm Number of attempts: 1 Airway Equipment and Method: Stylet and Video-laryngoscopy Placement Confirmation: ETT inserted through vocal cords under direct vision, positive ETCO2 and breath sounds checked- equal and bilateral Secured at: 20 cm Tube secured with: Tape Dental Injury: Teeth and Oropharynx as per pre-operative assessment

## 2022-03-21 NOTE — H&P (Signed)
SURGICAL CONSULTATION NOTE   HISTORY OF PRESENT ILLNESS (HPI):  67 y.o. female presented to [facility] ED for evaluation of abdominal pain since last night. Patient reports started having sharp abdominal pain last night in the right lower quadrant.  Pain localized to the right lower quadrant.  No evaluation.  No alleviating or aggravating factors.  In the ED she was found with mild leukocytosis.  There was tenderness of palpation in the right lower quadrant.  CT scan of the abdomen and pelvis shows inflammation of the appendix with appendicolith.  No free air, free fluid or abscess.  I personally evaluated the images of the CT scan.  Patient endorses that she was diagnosed with COVID 5 days ago.  She had some cough and runny nose but that got better couple of days ago.  She has not cough in the last 24 hours.  She was finally feeling better when she started having this abdominal pain.  Surgery is consulted by Sheran Luz, PA  in this context for evaluation and management of acute appendicitis.  PAST MEDICAL HISTORY (PMH):  Past Medical History:  Diagnosis Date   Allergy    allergic rhinitis   Depression    Hyperlipidemia    Iron deficiency anemia    Leukopenia      PAST SURGICAL HISTORY ([place]):  Past Surgical History:  Procedure Laterality Date   BREAST BIOPSY Left 2008   @ [facility], fibrocystic disease   CERVICAL BIOPSY  1986   cone biopsy   [place]   COLONOSCOPY  2008   [facility]; next due 2018.    COLONOSCOPY WITH PROPOFOL N/A 04/08/2017   Procedure: COLONOSCOPY WITH PROPOFOL;  Surgeon: Jonathon Bellows, MD;  Location: [facility] ENDOSCOPY;  Service: Gastroenterology;  Laterality: N/A;   ELBOW SURGERY  2003   chronic tendonitis   EXPLORATORY LAPAROTOMY  1994   for mennorhagia, no endometriosis   PILONIDAL CYST EXCISION  1973   TONSILLECTOMY     VAGINAL HYSTERECTOMY  1994   no cervix, still has ovaries     MEDICATIONS:  Prior to Admission medications   Medication Sig Start  Date End Date Taking? Authorizing Provider  calcium carbonate 200 MG capsule Take 250 mg by mouth 2 (two) times daily with a meal.    [provider]  clotrimazole-betamethasone (LOTRISONE) cream APPLY TO AFFECTED AREA AS DIRECTED 02/25/12   Bedsole, Amy E, MD  estradiol (ESTRACE) 0.5 MG tablet 1 TAB BY MOUTH DAILY ALONG WITH 1 MG TAB 01/20/12   Bedsole, Amy E, MD  estradiol (VIVELLE-DOT) 0.075 MG/24HR APPLY ONE PATCH TWICE WEEKLY 03/10/17   [provider]  fish oil-omega-3 fatty acids 1000 MG capsule Take 1 g by mouth daily.    [provider]  levothyroxine (SYNTHROID, LEVOTHROID) 50 MCG tablet levothyroxine 50 mcg tablet  TAKE 1 TABLET BY MOUTH ONCE DAILY 09/20/14   [provider]  meloxicam (MOBIC) 15 MG tablet meloxicam 15 mg tablet  TAKE 1 TABLET BY MOUTH DAILY    [provider]  methocarbamol (ROBAXIN) 500 MG tablet methocarbamol 500 mg tablet  TAKE 1 TABLET BY MOUTH 3 TIMES A DAY    [provider]  mometasone (ELOCON) 0.1 % lotion Use as directed 11/09/11   [provider]  Multiple Vitamin (MULTIVITAMIN) tablet Take 1 tablet by mouth daily.    [provider]  omega-3 acid ethyl esters (LOVAZA) 1 g capsule Take by mouth.    [provider]  phentermine (ADIPEX-P)  37.5 MG tablet phentermine 37.5 mg tablet  TAKE 1 TABLET BY MOUTH ONCE DAILY    [provider]  simvastatin (ZOCOR) 40 MG tablet TAKE 1 TABLET BY MOUTH ONCE A DAY 01/20/12   Bedsole, Amy E, MD  thyroid (ARMOUR THYROID) 60 MG tablet Armour Thyroid 60 mg tablet    [provider]  traMADol (ULTRAM) 50 MG tablet TAKE 1 TABLET BY MOUTH 3 TIMES A DAY 08/15/12   Bedsole, Amy E, MD  valACYclovir (VALTREX) 1000 MG tablet Take 1,000 mg by mouth 2 (two) times daily. For 1 day as needed for herpes outbreak     [provider]     ALLERGIES:  No Known Allergies   SOCIAL HISTORY:  Social History   Socioeconomic History    Marital status: Married    Spouse name: Not on file   Number of children: 2   Years of education: Not on file   Highest education level: Not on file  Occupational History   Occupation: housewife  Tobacco Use   Smoking status: Former    Years: 20.00    Types: Cigarettes    Quit date: 11/20/2009    Years since quitting: 12.3   Smokeless tobacco: Never  Substance and Sexual Activity   Alcohol use: Yes    Comment: wine, cocktails on weekends   Drug use: Yes    Comment: remote marijuana   Sexual activity: Not on file  Other Topics Concern   Not on file  Social History Narrative   Regular exercise 3 times a week   Diet: increasing fish, lean meats, low carb   Social Determinants of Health   Financial Resource Strain: Not on file  Food Insecurity: Not on file  Transportation Needs: Not on file  Physical Activity: Not on file  Stress: Not on file  Social Connections: Not on file  Intimate Partner Violence: Not on file      FAMILY HISTORY:  Family History  Problem Relation Age of Onset   Hyperlipidemia Mother    Arthritis Father    Coronary artery disease Father    Heart attack Father 52   Cancer Paternal Aunt 90       ovarian cancer    Cancer Paternal Uncle        esophageal cancer   Hypertension Sister    Hyperlipidemia Sister    Hypothyroidism Sister    Hypothyroidism Other        aunt     REVIEW OF SYSTEMS:  Constitutional: denies weight loss, fever, chills, or sweats  Eyes: denies any other vision changes, history of eye injury  ENT: denies sore throat, hearing problems  Respiratory: denies shortness of breath, wheezing  Cardiovascular: denies chest pain, palpitations  Gastrointestinal: positive abdominal pain, nausea and vomiting Genitourinary: denies burning with urination or urinary frequency Musculoskeletal: denies any other joint pains or cramps  Skin: denies any other rashes or skin discolorations  Neurological: denies any other headache, dizziness,  weakness  Psychiatric: denies any other depression, anxiety   All other review of systems were negative   VITAL SIGNS:  Temp:  [98.5 F (36.9 C)] 98.5 F (36.9 C) (01/07 0645) Pulse Rate:  [84] 84 (01/07 0645) Resp:  [18] 18 (01/07 0645) BP: (117)/(70) 117/70 (01/07 0645) SpO2:  [100 %] 100 % (01/07 0645) Weight:  [56.7 kg] 56.7 kg (01/07 0639)     Height: '5\' 4"'$  (162.6 cm) Weight: 56.7 kg BMI (Calculated): 21.45   INTAKE/OUTPUT:  This shift: No  intake/output data recorded.  Last 2 shifts: '@IOLAST2SHIFTS'$ @   PHYSICAL EXAM:  Constitutional:  -- Normal body habitus  -- Awake, alert, and oriented x3  Eyes:  -- Pupils equally round and reactive to light  -- No scleral icterus  Ear, nose, and throat:  -- No jugular venous distension  Pulmonary:  -- No crackles  -- Equal breath sounds bilaterally -- Breathing non-labored at rest Cardiovascular:  -- S1, S2 present  -- No pericardial rubs Gastrointestinal:  -- Abdomen soft, tender to palpation in the right lower quadrant, non-distended, no guarding or rebound tenderness -- No abdominal masses appreciated, pulsatile or otherwise  Musculoskeletal and Integumentary:  -- Wounds or skin discoloration: None appreciated -- Extremities: B/L UE and LE FROM, hands and feet warm, no edema  Neurologic:  -- Motor function: intact and symmetric -- Sensation: intact and symmetric   Labs:     Latest Ref Rng & Units 03/21/2022    6:46 AM 11/17/2011   10:54 AM 10/06/2011    8:40 AM  CBC  WBC 4.0 - 10.5 K/uL 13.0  4.8  3.5   Hemoglobin 12.0 - 15.0 g/dL 13.0  12.7  12.3   Hematocrit 36.0 - 46.0 % 39.6  37.0  36.5   Platelets 150 - 400 K/uL 212  198  191.0       Latest Ref Rng & Units 03/21/2022    6:46 AM 10/06/2011    8:40 AM 09/09/2010    8:18 AM  CMP  Glucose 70 - 99 mg/dL 131  86  90   BUN 8 - 23 mg/dL '16  18  13   '$ Creatinine 0.44 - 1.00 mg/dL 0.79  0.8  0.8   Sodium 135 - 145 mmol/L 137  140  142   Potassium 3.5 - 5.1 mmol/L 3.8   4.0  4.5   Chloride 98 - 111 mmol/L 103  105  109   CO2 22 - 32 mmol/L '23  28  29   '$ Calcium 8.9 - 10.3 mg/dL 9.3  9.2  9.3   Total Protein 6.5 - 8.1 g/dL 7.8  6.5  6.4   Total Bilirubin 0.3 - 1.2 mg/dL 0.7  0.8  0.8   Alkaline Phos 38 - 126 U/L 40  32  33   AST 15 - 41 U/L '27  25  28   '$ ALT 0 - 44 U/L '22  17  24     '$ Imaging studies:  EXAM: CT ABDOMEN AND PELVIS WITHOUT CONTRAST   TECHNIQUE: Multidetector CT imaging of the abdomen and pelvis was performed following the standard protocol without IV contrast.   RADIATION DOSE REDUCTION: This exam was performed according to the departmental dose-optimization program which includes automated exposure control, adjustment of the mA and/or kV according to patient size and/or use of iterative reconstruction technique.   COMPARISON:  05/08/2020   FINDINGS: Lower chest: Unremarkable.   Hepatobiliary: No suspicious focal abnormality in the liver on this study without intravenous contrast. There is no evidence for gallstones, gallbladder wall thickening, or pericholecystic fluid. No intrahepatic or extrahepatic biliary dilation.   Pancreas: No focal mass lesion. No dilatation of the main duct. No intraparenchymal cyst. No peripancreatic edema.   Spleen: No splenomegaly. No focal mass lesion.   Adrenals/Urinary Tract: No adrenal nodule or mass. 3 mm nonobstructing stone identified lower interpolar right kidney. 2-3 mm nonobstructing stone identified lower pole right kidney. No stones are noted in the left kidney. Tiny low-density lesion in the lower pole  left kidney is stable in the interval, compatible with a cyst. No followup imaging is recommended. No evidence for hydroureter. The urinary bladder appears normal for the degree of distention.   Stomach/Bowel: Stomach is unremarkable. No gastric wall thickening. No evidence of outlet obstruction. Duodenum is normally positioned as is the ligament of Treitz. No small bowel wall  thickening. No small bowel dilatation. The terminal ileum is normal. Retrocecal appendix is dilated up to 9 mm diameter with elongated configuration diffusely fluid-filled lumen. Appendiceal wall is ill-defined in there is some trace periappendiceal edema (axial 53/2). 5 mm appendicolith is noted in the appendiceal orifice (52/2 and well demonstrated on sagittal 41/6) with another appendicolith noted towards the tip of the appendix (axial 55/2 and sagittal 50/6). No gross colonic mass. No colonic wall thickening.   Vascular/Lymphatic: There is mild atherosclerotic calcification of the abdominal aorta without aneurysm. There is no gastrohepatic or hepatoduodenal ligament lymphadenopathy. No retroperitoneal or mesenteric lymphadenopathy. No pelvic sidewall lymphadenopathy.   Reproductive: Hysterectomy.  There is no adnexal mass.   Other: No intraperitoneal free fluid.   Musculoskeletal: No worrisome lytic or sclerotic osseous abnormality. Bilateral pars interarticularis defects noted at L5. Trace anterolisthesis of L5 on S1 associated.   IMPRESSION: 1. Retrocecal appendix is dilated up to 9 mm diameter with elongated configuration and diffusely fluid-filled lumen. Appendiceal wall is ill-defined in there is some trace periappendiceal edema. Appendicoliths are noted at the appendiceal orifice and towards the tip. Imaging features are compatible with acute appendicitis. No evidence for perforation or abscess. 2. Nonobstructing right renal stones. 3.  Aortic Atherosclerosis (ICD10-I70.0).     Electronically Signed   By: Misty Stanley M.D.   On: 03/21/2022 07:49  Assessment/Plan:  67 y.o. female with acute appendicitis, complicated by pertinent comorbidities including hypothyroidism.  Patient with history, physical exam and images consistent with acute appendicitis. Patient oriented about diagnosis and surgical management as treatment. Patient oriented about goals of surgery and  its risk including: bowel injury, infection, abscess, bleeding, leak from cecum, intestinal adhesions, bowel obstruction, fistula, injury to the ureter among others.  Patient understood and agreed to proceed with surgery. Will admit patient, already started on antibiotic therapy, will give IV hydration since patient is NPO and schedule to OR.   Recent COVID-positive with mild symptoms.  The symptoms resolved before onset of abdominal pain.  I think that is safe to proceed with appendectomy.  Arnold Long, MD

## 2022-03-21 NOTE — ED Notes (Addendum)
First Nurse Note: Pt arrives via [facility] from home with complaints of RLQ (near her ovary) that radiates to her R flank. Of note, pt tested positive for COVID 5 days ago.   VS with EMS

## 2022-03-21 NOTE — Transfer of Care (Signed)
Immediate Anesthesia Transfer of Care Note  Patient: Natasha Dixon  Procedure(s) Performed: XI ROBOTIC LAPAROSCOPIC ASSISTED APPENDECTOMY  Patient Location: Nursing Unit  Anesthesia Type:General  Level of Consciousness: awake, alert , and oriented  Airway & Oxygen Therapy: Patient Spontanous Breathing  Post-op Assessment: Report given to RN and Post -op Vital signs reviewed and stable  Post vital signs: Reviewed and stable  Last Vitals:  Vitals Value Taken Time  BP 118/69 03/21/22 1420  Temp 37.3 C 03/21/22 1419  Pulse 80 03/21/22 1420  Resp 18 03/21/22 1420  SpO2 100 % 03/21/22 1420    Last Pain:  Vitals:   03/21/22 1419  TempSrc: Oral  PainSc:          Complications: No notable events documented.

## 2022-03-22 ENCOUNTER — Encounter: Payer: Self-pay | Admitting: General Surgery

## 2022-03-22 DIAGNOSIS — K353 Acute appendicitis with localized peritonitis, without perforation or gangrene: Secondary | ICD-10-CM | POA: Diagnosis not present

## 2022-03-22 MED ORDER — TRAMADOL HCL 50 MG PO TABS: 50.0000 mg | ORAL_TABLET | Freq: Four times a day (QID) | ORAL | 0 refills | Status: AC | PRN

## 2022-03-22 NOTE — Progress Notes (Signed)
Pt has BP 88/57. Pt asymptomtic , provider Cintron called. Provider states that no action is needed at this time. Pt has no concerns, call bell within reach.   03/22/22 0009  Vitals  Temp 97.6 F (36.4 C)  BP (!) 88/57  MAP (mmHg) 68  BP Location Right Arm  BP Method Automatic  Patient Position (if appropriate) Lying  Pulse Rate (!) 59  Pulse Rate Source Monitor  Resp 20  Level of Consciousness  Level of Consciousness Alert  MEWS COLOR  MEWS Score Color Green  Oxygen Therapy  SpO2 98 %  O2 Device Room Air  MEWS Score  MEWS Temp 0  MEWS Systolic 1  MEWS Pulse 0  MEWS RR 0  MEWS LOC 0  MEWS Score 1  Provider Notification  Provider Name/Title Cintron MD  Date Provider Notified 03/22/22  Time Provider Notified 0018  Method of Notification Call  Notification Reason Other (Comment) (Low BP 88/57, pt asymptomatic)  Provider response Evaluate remotely (states that no action is needed at this time. Just monitor.)  Date of Provider Response 03/22/22  Time of Provider Response [phone]

## 2022-03-22 NOTE — Discharge Instructions (Signed)

## 2022-03-22 NOTE — Plan of Care (Signed)

## 2022-03-22 NOTE — Discharge Summary (Signed)
Patient ID: Natasha Dixon MRN: [id number] DOB/AGE: 1955/11/09 67 y.o.  Admit date: 03/21/2022 Discharge date: 03/22/2022   Discharge Diagnoses:  Principal Problem:   Acute appendicitis with localized peritonitis   Procedures: Robotic assisted laparoscopic appendectomy  Hospital Course: Patient with acute appendicitis.  She underwent robotic assisted laparoscopic appendectomy.  She has been recovering adequately.  Pain controlled.  Patient tolerating diet.  Patient ambulating.  Physical Exam Vitals reviewed.  Cardiovascular:     Rate and Rhythm: Normal rate and regular rhythm.  Pulmonary:     Effort: Pulmonary effort is normal.     Breath sounds: Normal breath sounds.  Abdominal:     General: Abdomen is flat. Bowel sounds are normal.     Palpations: Abdomen is soft.  Skin:    Capillary Refill: Capillary refill takes less than 2 seconds.  Neurological:     Mental Status: She is alert and oriented to person, place, and time.       Consults: None  Disposition: Discharge disposition: 01-Home or Self Care       Discharge Instructions     Diet - low sodium heart healthy   Complete by: As directed    Increase activity slowly   Complete by: As directed       Allergies as of 03/22/2022   No Known Allergies      Medication List     TAKE these medications    Armour Thyroid 60 MG tablet Generic drug: thyroid Armour Thyroid 60 mg tablet   atorvastatin 20 MG tablet Commonly known as: LIPITOR Take 60 mg by mouth daily.   calcium carbonate 200 MG capsule Take 250 mg by mouth 2 (two) times daily with a meal.   clotrimazole-betamethasone cream Commonly known as: LOTRISONE APPLY TO AFFECTED AREA AS DIRECTED   estradiol 0.5 MG tablet Commonly known as: ESTRACE 1 TAB BY MOUTH DAILY ALONG WITH 1 MG TAB   estradiol 0.075 MG/24HR Commonly known as: VIVELLE-DOT APPLY ONE PATCH TWICE WEEKLY   fish oil-omega-3 fatty acids 1000 MG capsule Take 1 g by mouth  daily.   levothyroxine 50 MCG tablet Commonly known as: SYNTHROID levothyroxine 50 mcg tablet  TAKE 1 TABLET BY MOUTH ONCE DAILY   meloxicam 15 MG tablet Commonly known as: MOBIC meloxicam 15 mg tablet  TAKE 1 TABLET BY MOUTH DAILY   methocarbamol 500 MG tablet Commonly known as: ROBAXIN methocarbamol 500 mg tablet  TAKE 1 TABLET BY MOUTH 3 TIMES A DAY   mometasone 0.1 % lotion Commonly known as: ELOCON Use as directed   multivitamin tablet Take 1 tablet by mouth daily.   omega-3 acid ethyl esters 1 g capsule Commonly known as: LOVAZA Take by mouth.   phentermine 37.5 MG tablet Commonly known as: ADIPEX-P phentermine 37.5 mg tablet  TAKE 1 TABLET BY MOUTH ONCE DAILY   simvastatin 40 MG tablet Commonly known as: ZOCOR TAKE 1 TABLET BY MOUTH ONCE A DAY   traMADol 50 MG tablet Commonly known as: ULTRAM TAKE 1 TABLET BY MOUTH 3 TIMES A DAY What changed: Another medication with the same name was added. Make sure you understand how and when to take each.   traMADol 50 MG tablet Commonly known as: Ultram Take 1 tablet (50 mg total) by mouth every 6 (six) hours as needed. What changed: You were already taking a medication with the same name, and this prescription was added. Make sure you understand how and when to take each.   Valtrex 1000 MG tablet  Generic drug: valACYclovir Take 1,000 mg by mouth 2 (two) times daily. For 1 day as needed for herpes outbreak        Follow-up Information     Herbert Pun, MD Follow up in 2 week(s).   Specialty: General Surgery Why: Follow up after appendectomy Contact information: [address] [place] [address] [phone]

## 2022-03-22 NOTE — Plan of Care (Signed)
  Problem: Activity: Goal: Risk for activity intolerance will decrease Outcome: Progressing   Problem: Safety: Goal: Ability to remain free from injury will improve Outcome: Progressing   

## 2022-03-22 NOTE — TOC Initial Note (Signed)
Transition of Care [facility]) - Initial/Assessment Note    Patient Details  Name: Natasha Dixon MRN: [id number] Date of Birth: Mar 26, 1955  Transition of Care [facility]) CM/SW Contact:    Beverly Sessions, RN Phone Number: 03/22/2022, 9:10 AM  Clinical Narrative:                   Transition of Care [facility]) Screening Note   Patient Details  Name: Natasha Dixon Date of Birth: 02/05/1956   Transition of Care [facility]) CM/SW Contact:    Beverly Sessions, RN Phone Number: 03/22/2022, 9:10 AM    Transition of Care Department [facility]) has reviewed patient and no TOC needs have been identified at this time. We will continue to monitor patient advancement through interdisciplinary progression rounds. If new patient transition needs arise, please place a TOC consult.         Patient Goals and CMS Choice            Expected Discharge Plan and Services         Expected Discharge Date: 03/22/22                                    Prior Living Arrangements/Services                       Activities of Daily Living Home Assistive Devices/Equipment: None ADL Screening (condition at time of admission) Patient's cognitive ability adequate to safely complete daily activities?: Yes Is the patient deaf or have difficulty hearing?: No Does the patient have difficulty seeing, even when wearing glasses/contacts?: No Does the patient have difficulty concentrating, remembering, or making decisions?: No Patient able to express need for assistance with ADLs?: Yes Does the patient have difficulty dressing or bathing?: No Independently performs ADLs?: Yes (appropriate for developmental age) Does the patient have difficulty walking or climbing stairs?: No Weakness of Legs: None Weakness of Arms/Hands: None  Permission Sought/Granted                  Emotional Assessment              Admission diagnosis:  Acute appendicitis with localized peritonitis  [K35.30] Acute appendicitis, unspecified acute appendicitis type [K35.80] Patient Active Problem List   Diagnosis Date Noted   Acute appendicitis with localized peritonitis 03/21/2022   Impingement syndrome of shoulder region 04/19/2016   Leukopenia    Iron deficiency anemia    Leucopenia 10/15/2011   Fatigue 10/15/2011   JAW PAIN 01/16/2009   POSTMENOPAUSAL STATUS 06/26/2008   HERPES SIMPLEX INFECTION 05/18/2007   HYPERLIPIDEMIA 05/18/2007   ANEMIA-IRON DEFICIENCY 05/18/2007   Hx of DEPRESSION 05/18/2007   ALLERGIC RHINITIS 05/18/2007   PCP:  Michael Boston, MD Pharmacy:   [id number] [place] [id number]: [phone]: [phone]    Social Determinants of Health (SDOH) Social History: SDOH Screenings   Food Insecurity: No Food Insecurity (03/21/2022)  Housing: Low Risk  (03/21/2022)  Transportation Needs: No Transportation Needs (03/21/2022)  Utilities: Not At Risk (03/21/2022)  Tobacco Use: Medium Risk (03/21/2022)   SDOH Interventions:     Readmission Risk Interventions     No data to display

## 2022-03-22 NOTE — Progress Notes (Signed)
Discharge instructions reviewed with patient including followup visits and new medications.  Understanding was verbalized and all questions were answered.  IV removed without complication; patient tolerated well.  Patient discharged home via wheelchair in stable condition escorted by volunteer staff.  

## 2022-03-23 LAB — SURGICAL PATHOLOGY

## 2022-06-07 IMAGING — US US AORTA
1 series · 14 of 18 positions shown · non-contrast
Comparison: CT abdomen pelvis dated 05/08/2020.

CLINICAL DATA: Aortic atherosclerosis.

EXAM:
ULTRASOUND OF ABDOMINAL AORTA
TECHNIQUE: Ultrasound examination of the abdominal aorta and proximal common
iliac arteries was performed to evaluate for aneurysm. Additional
color and Doppler images of the distal aorta were obtained to
document patency.

[Series 1: us aorta · 0.18mm/px · 14 of 18 slices shown]
[im 1/18]
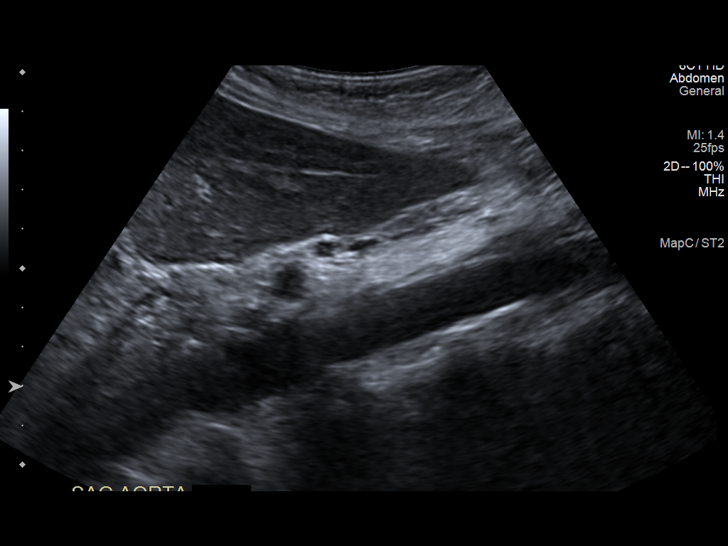
[im 2/18]
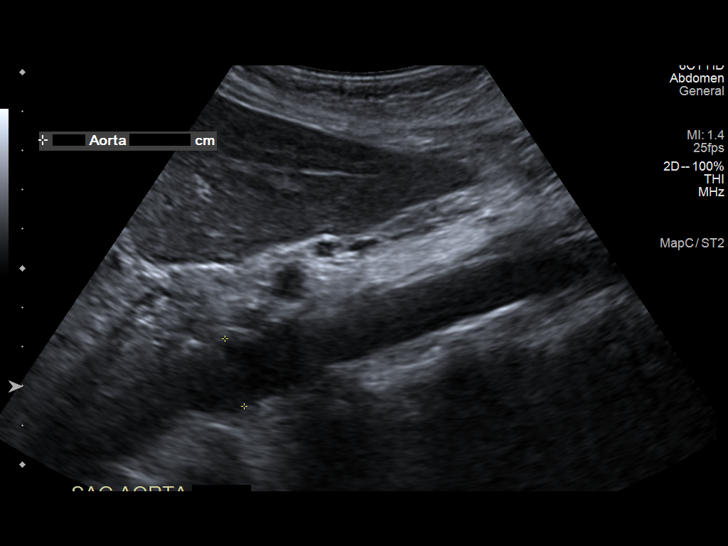
[im 4/18]
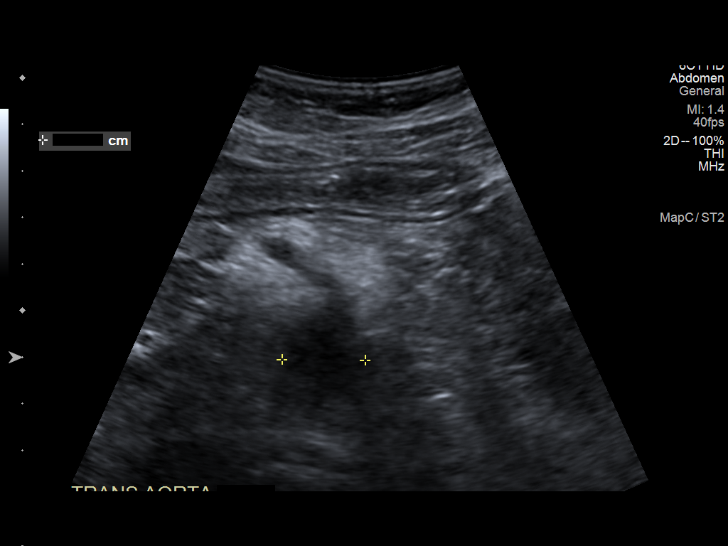
[im 5/18]
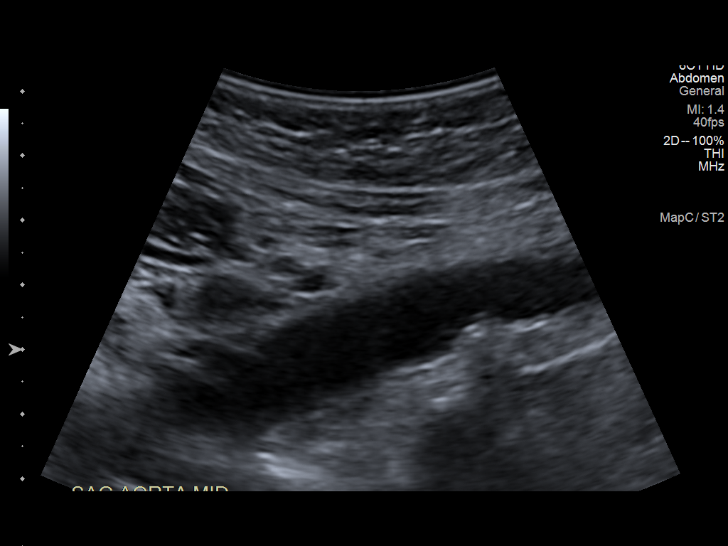
[im 6/18]
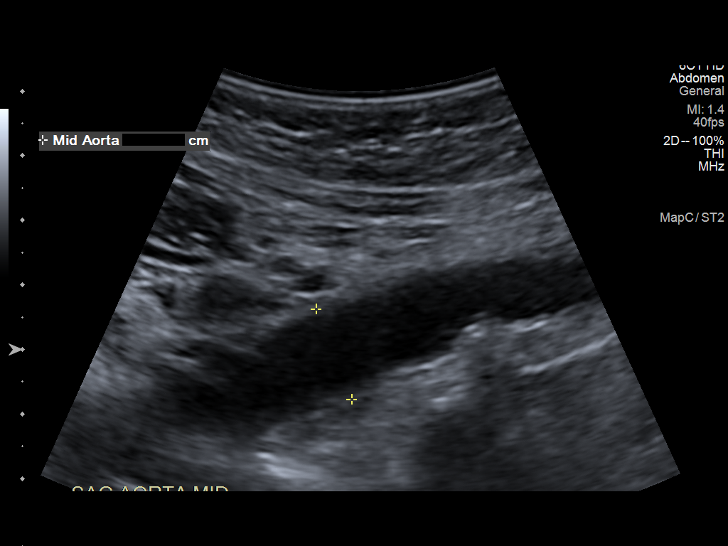
[im 8/18]
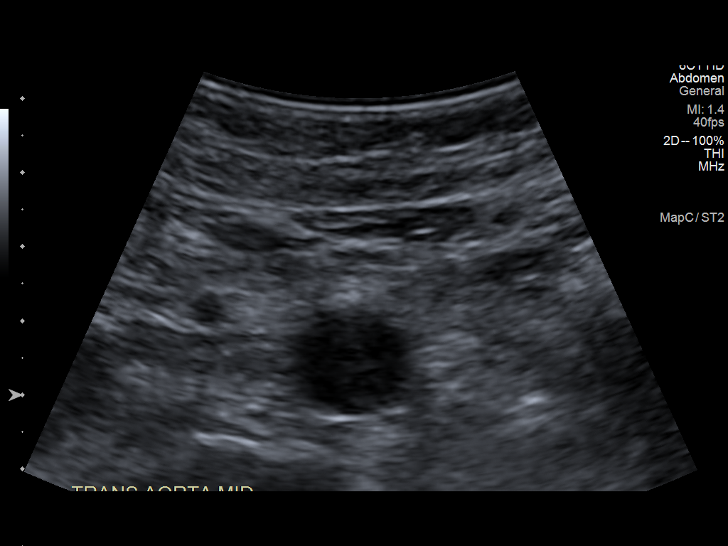
[im 9/18]
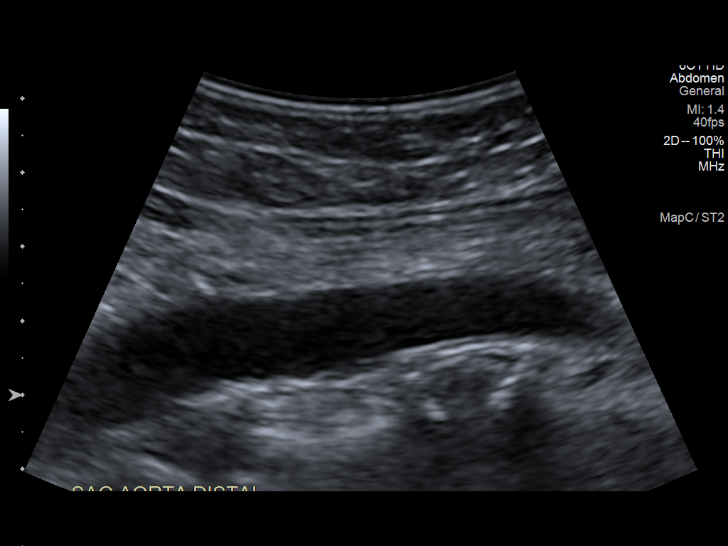
[im 10/18]
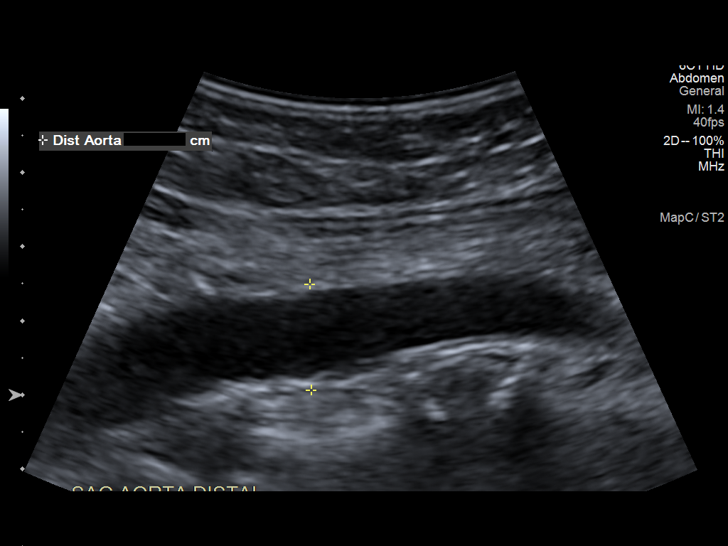
[im 11/18]
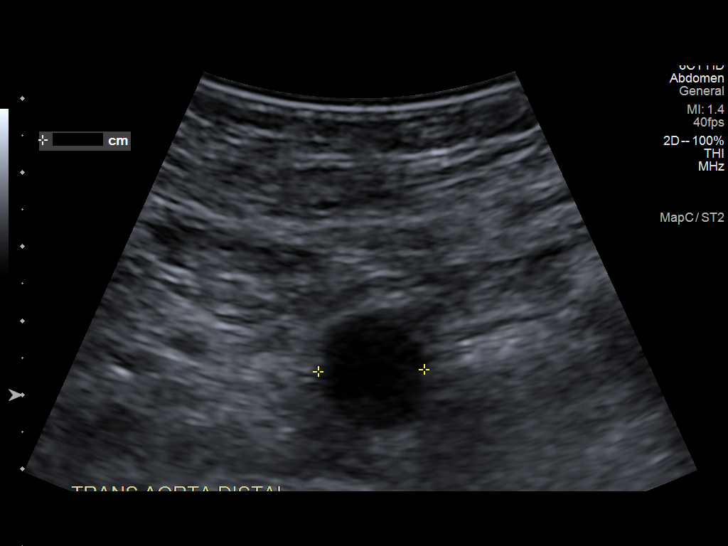
[im 13/18]
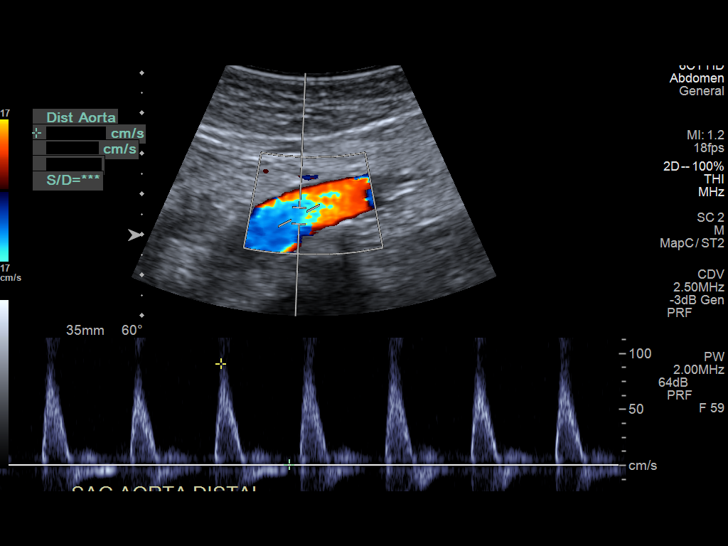
[im 14/18]
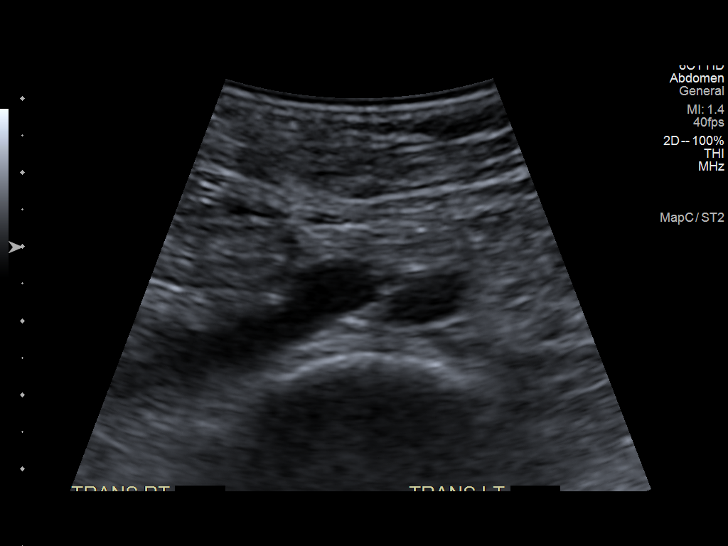
[im 15/18]
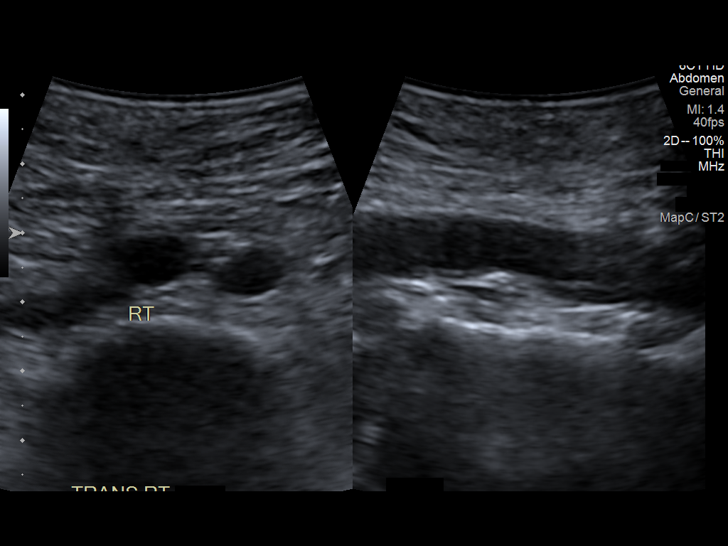
[im 17/18]
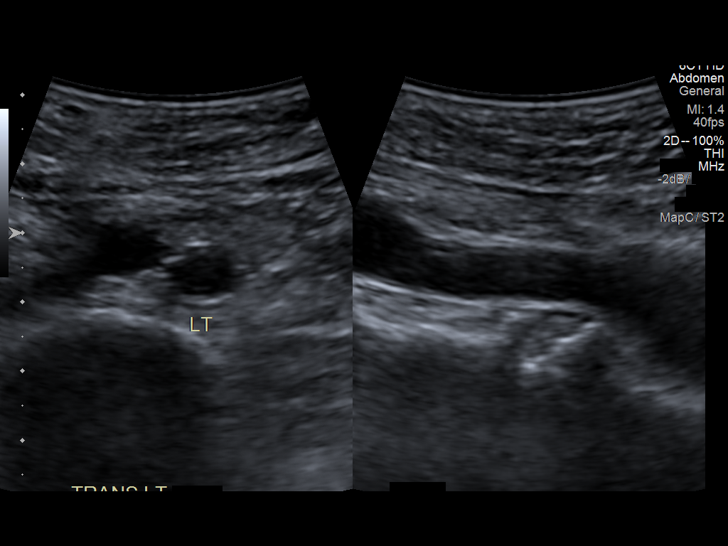
[im 18/18]
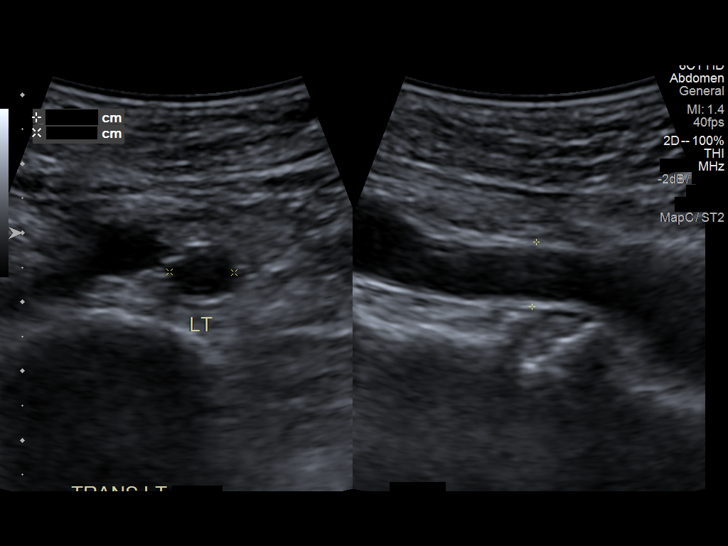

[14 of 18 positions shown; findings below may reference images not displayed]

FINDINGS: Abdominal aortic measurements as follows:

Proximal:  1.8 x 1.8 cm

Mid:  1.5 x 1.5 cm

Distal:  1.4 x 1.4 cm
Patent: Yes, peak systolic velocity is 91 cm/s

Right common iliac artery: 0.9 x 0.9 cm

Left common iliac artery: 0.9 x 0.9 cm

Minimal atherosclerotic calcification of the abdominal aorta.
IMPRESSION: 1. No abdominal aortic aneurysm.
2. Minimal Aortic Atherosclerosis ([id number]).

## 2022-07-27 ENCOUNTER — Other Ambulatory Visit: Payer: Self-pay | Admitting: Internal Medicine

## 2022-07-27 DIAGNOSIS — E785 Hyperlipidemia, unspecified: Secondary | ICD-10-CM

## 2022-08-18 ENCOUNTER — Ambulatory Visit
Admission: RE | Admit: 2022-08-18 | Discharge: 2022-08-18 | Disposition: A | Discharge status: Home / Self Care | Payer: No Typology Code available for payment source | Source: Ambulatory Visit | Attending: Internal Medicine

## 2022-08-18 DIAGNOSIS — E785 Hyperlipidemia, unspecified: Secondary | ICD-10-CM
